# Patient Record
Sex: Female | Born: 1945
Health system: Southern US, Community
[De-identification: ages and names within clinical notes are randomized; demographics above are authoritative.]

## PROBLEM LIST (undated history)

## (undated) DIAGNOSIS — F419 Anxiety disorder, unspecified: Secondary | ICD-10-CM

## (undated) DIAGNOSIS — I10 Essential (primary) hypertension: Secondary | ICD-10-CM

## (undated) DIAGNOSIS — R011 Cardiac murmur, unspecified: Secondary | ICD-10-CM

## (undated) DIAGNOSIS — K219 Gastro-esophageal reflux disease without esophagitis: Secondary | ICD-10-CM

## (undated) DIAGNOSIS — R399 Unspecified symptoms and signs involving the genitourinary system: Secondary | ICD-10-CM

## (undated) DIAGNOSIS — K649 Unspecified hemorrhoids: Secondary | ICD-10-CM

## (undated) DIAGNOSIS — K589 Irritable bowel syndrome without diarrhea: Secondary | ICD-10-CM

## (undated) DIAGNOSIS — E7800 Pure hypercholesterolemia, unspecified: Secondary | ICD-10-CM

## (undated) DIAGNOSIS — Z8719 Personal history of other diseases of the digestive system: Secondary | ICD-10-CM

## (undated) DIAGNOSIS — K5790 Diverticulosis of intestine, part unspecified, without perforation or abscess without bleeding: Secondary | ICD-10-CM

## (undated) DIAGNOSIS — M1990 Unspecified osteoarthritis, unspecified site: Secondary | ICD-10-CM

## (undated) HISTORY — PX: BREAST LUMPECTOMY: SHX2

## (undated) HISTORY — DX: Irritable bowel syndrome, unspecified: K58.9

## (undated) HISTORY — PX: ABDOMINAL HYSTERECTOMY: SHX81

## (undated) HISTORY — DX: Gastro-esophageal reflux disease without esophagitis: K21.9

## (undated) HISTORY — PX: CHOLECYSTECTOMY: SHX55

---

## 2000-06-27 ENCOUNTER — Encounter: Payer: Self-pay | Admitting: Specialist

## 2000-07-01 ENCOUNTER — Ambulatory Visit (HOSPITAL_COMMUNITY): Admission: RE | Admit: 2000-07-01 | Discharge: 2000-07-01 | Payer: Self-pay | Admitting: Specialist

## 2000-08-16 ENCOUNTER — Encounter (HOSPITAL_COMMUNITY): Admission: RE | Admit: 2000-08-16 | Discharge: 2000-09-15 | Payer: Self-pay | Admitting: Specialist

## 2000-11-15 ENCOUNTER — Encounter: Payer: Self-pay | Admitting: Obstetrics and Gynecology

## 2000-11-15 ENCOUNTER — Ambulatory Visit (HOSPITAL_COMMUNITY): Admission: RE | Admit: 2000-11-15 | Discharge: 2000-11-15 | Payer: Self-pay | Admitting: Obstetrics and Gynecology

## 2001-08-11 ENCOUNTER — Ambulatory Visit (HOSPITAL_COMMUNITY): Admission: RE | Admit: 2001-08-11 | Discharge: 2001-08-11 | Payer: Self-pay | Admitting: Internal Medicine

## 2001-08-11 HISTORY — PX: COLONOSCOPY: SHX174

## 2001-12-07 ENCOUNTER — Ambulatory Visit (HOSPITAL_COMMUNITY): Admission: RE | Admit: 2001-12-07 | Discharge: 2001-12-07 | Payer: Self-pay | Admitting: Obstetrics and Gynecology

## 2001-12-07 ENCOUNTER — Encounter: Payer: Self-pay | Admitting: Obstetrics and Gynecology

## 2001-12-13 ENCOUNTER — Ambulatory Visit (HOSPITAL_COMMUNITY): Admission: RE | Admit: 2001-12-13 | Discharge: 2001-12-13 | Payer: Self-pay | Admitting: Obstetrics and Gynecology

## 2001-12-13 ENCOUNTER — Encounter: Payer: Self-pay | Admitting: Obstetrics and Gynecology

## 2002-12-19 ENCOUNTER — Ambulatory Visit (HOSPITAL_COMMUNITY): Admission: RE | Admit: 2002-12-19 | Discharge: 2002-12-19 | Payer: Self-pay | Admitting: General Surgery

## 2002-12-19 ENCOUNTER — Encounter: Payer: Self-pay | Admitting: General Surgery

## 2003-12-20 ENCOUNTER — Ambulatory Visit (HOSPITAL_COMMUNITY): Admission: RE | Admit: 2003-12-20 | Discharge: 2003-12-20 | Payer: Self-pay | Admitting: Obstetrics and Gynecology

## 2004-08-05 ENCOUNTER — Ambulatory Visit (HOSPITAL_COMMUNITY): Admission: RE | Admit: 2004-08-05 | Discharge: 2004-08-05 | Payer: Self-pay | Admitting: Family Medicine

## 2004-08-07 ENCOUNTER — Ambulatory Visit (HOSPITAL_COMMUNITY): Admission: RE | Admit: 2004-08-07 | Discharge: 2004-08-07 | Payer: Self-pay | Admitting: Family Medicine

## 2004-09-28 ENCOUNTER — Inpatient Hospital Stay (HOSPITAL_COMMUNITY): Admission: RE | Admit: 2004-09-28 | Discharge: 2004-09-30 | Payer: Self-pay | Admitting: Obstetrics and Gynecology

## 2004-12-23 ENCOUNTER — Ambulatory Visit (HOSPITAL_COMMUNITY): Admission: RE | Admit: 2004-12-23 | Discharge: 2004-12-23 | Payer: Self-pay | Admitting: Obstetrics and Gynecology

## 2005-05-12 ENCOUNTER — Ambulatory Visit (HOSPITAL_COMMUNITY): Admission: RE | Admit: 2005-05-12 | Discharge: 2005-05-12 | Payer: Self-pay | Admitting: Family Medicine

## 2005-12-27 ENCOUNTER — Ambulatory Visit (HOSPITAL_COMMUNITY): Admission: RE | Admit: 2005-12-27 | Discharge: 2005-12-27 | Payer: Self-pay | Admitting: Obstetrics and Gynecology

## 2006-04-18 ENCOUNTER — Ambulatory Visit (HOSPITAL_COMMUNITY): Admission: RE | Admit: 2006-04-18 | Discharge: 2006-04-18 | Payer: Self-pay | Admitting: *Deleted

## 2007-02-09 ENCOUNTER — Ambulatory Visit (HOSPITAL_COMMUNITY): Admission: RE | Admit: 2007-02-09 | Discharge: 2007-02-09 | Payer: Self-pay | Admitting: Obstetrics and Gynecology

## 2007-06-08 ENCOUNTER — Ambulatory Visit (HOSPITAL_COMMUNITY): Admission: RE | Admit: 2007-06-08 | Discharge: 2007-06-08 | Payer: Self-pay | Admitting: Family Medicine

## 2007-09-09 ENCOUNTER — Emergency Department (HOSPITAL_COMMUNITY): Admission: EM | Admit: 2007-09-09 | Discharge: 2007-09-10 | Payer: Self-pay | Admitting: Emergency Medicine

## 2008-02-28 ENCOUNTER — Ambulatory Visit (HOSPITAL_COMMUNITY): Admission: RE | Admit: 2008-02-28 | Discharge: 2008-02-28 | Payer: Self-pay | Admitting: Obstetrics and Gynecology

## 2008-08-08 ENCOUNTER — Ambulatory Visit (HOSPITAL_COMMUNITY): Admission: RE | Admit: 2008-08-08 | Discharge: 2008-08-08 | Payer: Self-pay | Admitting: Family Medicine

## 2008-09-17 DIAGNOSIS — K644 Residual hemorrhoidal skin tags: Secondary | ICD-10-CM | POA: Insufficient documentation

## 2008-09-17 DIAGNOSIS — G43909 Migraine, unspecified, not intractable, without status migrainosus: Secondary | ICD-10-CM | POA: Insufficient documentation

## 2008-09-17 DIAGNOSIS — M129 Arthropathy, unspecified: Secondary | ICD-10-CM | POA: Insufficient documentation

## 2008-09-17 DIAGNOSIS — I10 Essential (primary) hypertension: Secondary | ICD-10-CM | POA: Insufficient documentation

## 2008-09-17 DIAGNOSIS — Z8719 Personal history of other diseases of the digestive system: Secondary | ICD-10-CM | POA: Insufficient documentation

## 2008-09-17 DIAGNOSIS — Z8659 Personal history of other mental and behavioral disorders: Secondary | ICD-10-CM

## 2008-09-17 DIAGNOSIS — K921 Melena: Secondary | ICD-10-CM | POA: Insufficient documentation

## 2008-09-17 DIAGNOSIS — E7800 Pure hypercholesterolemia, unspecified: Secondary | ICD-10-CM | POA: Insufficient documentation

## 2008-09-18 ENCOUNTER — Ambulatory Visit: Payer: Self-pay | Admitting: Internal Medicine

## 2008-09-18 DIAGNOSIS — R1319 Other dysphagia: Secondary | ICD-10-CM

## 2008-09-24 ENCOUNTER — Ambulatory Visit: Payer: Self-pay | Admitting: Internal Medicine

## 2008-09-24 ENCOUNTER — Ambulatory Visit (HOSPITAL_COMMUNITY): Admission: RE | Admit: 2008-09-24 | Discharge: 2008-09-24 | Payer: Self-pay | Admitting: Internal Medicine

## 2008-09-24 HISTORY — PX: ESOPHAGOGASTRODUODENOSCOPY: SHX1529

## 2009-03-03 ENCOUNTER — Ambulatory Visit (HOSPITAL_COMMUNITY): Admission: RE | Admit: 2009-03-03 | Discharge: 2009-03-03 | Payer: Self-pay | Admitting: Family Medicine

## 2010-03-05 ENCOUNTER — Ambulatory Visit (HOSPITAL_COMMUNITY): Admission: RE | Admit: 2010-03-05 | Discharge: 2010-03-05 | Payer: Self-pay | Admitting: Family Medicine

## 2010-04-18 ENCOUNTER — Emergency Department (HOSPITAL_COMMUNITY)
Admission: EM | Admit: 2010-04-18 | Discharge: 2010-04-18 | Payer: Self-pay | Source: Home / Self Care | Admitting: Emergency Medicine

## 2010-05-13 ENCOUNTER — Ambulatory Visit: Admit: 2010-05-13 | Payer: Self-pay | Admitting: Gastroenterology

## 2010-06-11 ENCOUNTER — Ambulatory Visit (INDEPENDENT_AMBULATORY_CARE_PROVIDER_SITE_OTHER): Payer: BC Managed Care – PPO | Admitting: Otolaryngology

## 2010-06-11 DIAGNOSIS — J343 Hypertrophy of nasal turbinates: Secondary | ICD-10-CM

## 2010-06-11 DIAGNOSIS — H6980 Other specified disorders of Eustachian tube, unspecified ear: Secondary | ICD-10-CM

## 2010-06-11 DIAGNOSIS — J310 Chronic rhinitis: Secondary | ICD-10-CM

## 2010-07-09 ENCOUNTER — Ambulatory Visit (INDEPENDENT_AMBULATORY_CARE_PROVIDER_SITE_OTHER): Payer: BC Managed Care – PPO | Admitting: Otolaryngology

## 2010-07-20 LAB — POCT CARDIAC MARKERS
Myoglobin, poc: 54.8 ng/mL (ref 12–200)
Troponin i, poc: <0.05 ng/mL (ref 0.00–0.09)

## 2010-07-20 LAB — BASIC METABOLIC PANEL
Chloride: 107 mEq/L (ref 96–112)
Creatinine, Ser: 0.69 mg/dL (ref 0.4–1.2)
GFR calc non Af Amer: >60 mL/min (ref >60)
Sodium: 140 mEq/L (ref 135–145)

## 2010-07-28 ENCOUNTER — Emergency Department (HOSPITAL_COMMUNITY): Payer: BC Managed Care – PPO

## 2010-07-28 ENCOUNTER — Encounter (HOSPITAL_COMMUNITY): Payer: Self-pay | Admitting: Radiology

## 2010-07-28 ENCOUNTER — Emergency Department (HOSPITAL_COMMUNITY)
Admission: EM | Admit: 2010-07-28 | Discharge: 2010-07-28 | Disposition: A | Discharge status: Home / Self Care | Payer: BC Managed Care – PPO | Attending: Emergency Medicine | Admitting: Emergency Medicine

## 2010-07-28 DIAGNOSIS — R110 Nausea: Secondary | ICD-10-CM | POA: Insufficient documentation

## 2010-07-28 DIAGNOSIS — M62838 Other muscle spasm: Secondary | ICD-10-CM | POA: Insufficient documentation

## 2010-07-28 DIAGNOSIS — K219 Gastro-esophageal reflux disease without esophagitis: Secondary | ICD-10-CM | POA: Insufficient documentation

## 2010-07-28 DIAGNOSIS — R071 Chest pain on breathing: Secondary | ICD-10-CM | POA: Insufficient documentation

## 2010-07-28 DIAGNOSIS — E785 Hyperlipidemia, unspecified: Secondary | ICD-10-CM | POA: Insufficient documentation

## 2010-07-28 DIAGNOSIS — Z79899 Other long term (current) drug therapy: Secondary | ICD-10-CM | POA: Insufficient documentation

## 2010-07-28 DIAGNOSIS — R002 Palpitations: Secondary | ICD-10-CM | POA: Insufficient documentation

## 2010-07-28 DIAGNOSIS — F411 Generalized anxiety disorder: Secondary | ICD-10-CM | POA: Insufficient documentation

## 2010-07-28 DIAGNOSIS — I10 Essential (primary) hypertension: Secondary | ICD-10-CM | POA: Insufficient documentation

## 2010-07-28 DIAGNOSIS — R1011 Right upper quadrant pain: Secondary | ICD-10-CM | POA: Insufficient documentation

## 2010-07-28 HISTORY — DX: Essential (primary) hypertension: I10

## 2010-07-28 LAB — T4: T4, Total: 8.1 ug/dL (ref 5.0–12.5)

## 2010-07-28 LAB — DIFFERENTIAL
Basophils Absolute: 0 10*3/uL (ref 0.0–0.1)
Lymphocytes Relative: 25 % (ref 12–46)
Neutrophils Relative %: 67 % (ref 43–77)

## 2010-07-28 LAB — COMPREHENSIVE METABOLIC PANEL
ALT: 25 U/L (ref 0–35)
AST: 20 U/L (ref 0–37)
Alkaline Phosphatase: 65 U/L (ref 39–117)
CO2: 26 mEq/L (ref 19–32)
Chloride: 104 mEq/L (ref 96–112)
GFR calc non Af Amer: >60 mL/min (ref >60)
Potassium: 3.7 mEq/L (ref 3.5–5.1)
Sodium: 139 mEq/L (ref 135–145)
Total Bilirubin: 0.7 mg/dL (ref 0.3–1.2)
Total Protein: 7.3 g/dL (ref 6.0–8.3)

## 2010-07-28 LAB — CBC
MCHC: 33 g/dL (ref 30.0–36.0)
Platelets: 247 10*3/uL (ref 150–400)
RDW: 12.9 % (ref 11.5–15.5)
WBC: 8.4 10*3/uL (ref 4.0–10.5)

## 2010-07-28 LAB — POCT CARDIAC MARKERS
CKMB, poc: <1 ng/mL — ABNORMAL LOW (ref 1.0–8.0)
Myoglobin, poc: 48.5 ng/mL (ref 12–200)
Troponin i, poc: <0.05 ng/mL (ref 0.00–0.09)

## 2010-07-28 LAB — TSH: TSH: 3.455 u[IU]/mL (ref 0.350–4.500)

## 2010-08-03 ENCOUNTER — Other Ambulatory Visit (INDEPENDENT_AMBULATORY_CARE_PROVIDER_SITE_OTHER): Payer: Self-pay | Admitting: Internal Medicine

## 2010-08-03 ENCOUNTER — Ambulatory Visit (INDEPENDENT_AMBULATORY_CARE_PROVIDER_SITE_OTHER): Payer: BC Managed Care – PPO | Admitting: Internal Medicine

## 2010-08-03 DIAGNOSIS — R634 Abnormal weight loss: Secondary | ICD-10-CM

## 2010-08-03 DIAGNOSIS — R1013 Epigastric pain: Secondary | ICD-10-CM

## 2010-08-03 DIAGNOSIS — R110 Nausea: Secondary | ICD-10-CM

## 2010-08-05 ENCOUNTER — Ambulatory Visit (HOSPITAL_COMMUNITY)
Admission: RE | Admit: 2010-08-05 | Discharge: 2010-08-05 | Disposition: A | Discharge status: Home / Self Care | Payer: BC Managed Care – PPO | Source: Ambulatory Visit | Attending: Internal Medicine | Admitting: Internal Medicine

## 2010-08-05 DIAGNOSIS — R110 Nausea: Secondary | ICD-10-CM

## 2010-08-05 DIAGNOSIS — R1013 Epigastric pain: Secondary | ICD-10-CM

## 2010-08-05 DIAGNOSIS — R634 Abnormal weight loss: Secondary | ICD-10-CM

## 2010-08-05 MED ORDER — IOHEXOL 300 MG/ML  SOLN
100.0000 mL | Freq: Once | INTRAMUSCULAR | Status: AC | PRN
  Administered 2010-08-05: 100 mL via INTRAVENOUS

## 2010-08-11 NOTE — Consult Note (Addendum)
Allison [name]              ACCOUNT NO.:  [id number]  MEDICAL RECORD NO.:  [id number]           PATIENT TYPE:  E  LOCATION:  [facility]                          FACILITY:  [facility]  PHYSICIAN:  Lionel December, M.D.    DATE OF BIRTH:  January 04, 1946  DATE OF CONSULTATION:  08/03/2010 DATE OF DISCHARGE:  07/28/2010                                CONSULTATION   PRESENTING COMPLAINT:  Nausea, anorexia, epigastric pain and weight loss.  HISTORY OF PRESENT ILLNESS:  Allison [name] is a 65 year old Caucasian female who is referred through courtesy of Dr. Karleen Hampshire for GI evaluation. She is well-known to me from previous evaluation in May 2004.  Since then she was seen by Dr. Jena Gauss for dysphagia and had her esophagus dilated in May 2010.  She now presents with multiple symptoms.  She states her present illness began in November 2011, when she was working in a yard and developed congestion.  She thought she might have an allergy to dust etc.  She also developed pain in her neck radiating to her years.  She was seen by Dr. Sherwood Gambler in December and her PPI was doubled.  Ever since she has been worried and nervous and over the last 1 week she has developed intractable nausea.  She has lost her appetite.  She states food does not taste the same.  She complains of butterflies in the stomach.  She also complains of intermittent sharp pain in her epigastrium radiating to the right side.  She states she was on chlordiazepoxide/amitriptyline for couple of years but she stopped the last year.  With onset of these symptoms she was put back on the same medicine and she took it for about 2 weeks but she just did not feel right and therefore stopped the medicine.  She states her nausea is worse in the morning and as the day progresses, she seemed to get better and by evening she is free of nausea.  At times, she does feel depressed.  She states she has been busy with her family and 5 beautifully grandchildren.  She  states she lost her husband 11 years ago and she believes she has been coping this loss quite well.  She tells me that she feels that there is something bad going on with her which she is not sure what.  She denies heartburn, dysphagia, fever or chills.  She states in the last 2 years she has lost 30 pounds but she believes most of it is a voluntary weight loss.  Six days ago she developed chest pain and she was seen in emergency room at [facility] and she had CBC, Chem-20, cardiac enzymes, EKG and a chest x- ray and all of these studies were normal.  She was given dose of Zofran and she believes it made her constipated, but usually her bowels have been regular.  She states with constipation she strains and she notes scant amount of blood with her stools.  She denies melena or frank rectal bleeding.  She also denies dysuria or hematuria.  She states few weeks ago when we had snow she opened the trunk  of her car and while she was looking inside the front gate hit her back and she does not believe that she had any injury because of it.  CURRENT MEDICATIONS: 1. Simvastatin 20 mg daily. 2. Ibuprofen 800 mg daily p.r.n. which is no more than two to three     times a month. 3. Fish oil 1.2 grams once or twice daily. 4. Celebrex 100 mg b.i.d. just begun and she was seen in the emergency     room. 5. ASA 81 mg daily. 6. Zofran 8 mg q. 6 p.r.n.  PAST MEDICAL HISTORY:  She has been hypertensive for about 20 years. She has been on therapy for hyperlipidemia for about 5 years.  PAST SURGICAL HISTORY:  She had C-section in 1973 and again 1976.  She had left knee arthroscopy in 2002.  She had a hysterectomy in 1991 and in 2007 she had bilateral oophorectomy and cholecystectomy at the same time.  She had a benign lesion removed from her left breast in 1994. She had a screening colonoscopy by me in April 2003 and she had EGD with ED by Dr. Jena Gauss in May 2010.  She had Schatzki ring and a small  sliding hiatal hernia.  History of stress disorder.  ALLERGIES:  CODEINE and SULFA causing hives and nausea respectively.  FAMILY HISTORY:  Mother has COPD and died at 34.  Father is 27 years old and doing fair.  She has a sister age 54 with asthma and she has two brothers, one is diabetic, other one has GERD and asthma and she lost one brother age at 37.  He committed suicide.  SOCIAL HISTORY:  She is widowed.  She has two sons in good health.  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  She has never smoked cigarettes or drank alcohol.  OBJECTIVE:  VITAL SIGNS:  Weight 162 pounds, she is 64 inches tall, pulse 72 per minute, blood pressure 110/70, temperature is 97.7. HEENT:  Conjunctivae pink.  Sclerae nonicteric.  Oropharyngeal mucosa is normal. NECK:  No neck masses or thyromegaly noted. CARDIAC:  Regular rhythm.  Normal S1-S2.  No murmur or gallop noted. LUNGS:  Clear to auscultation. ABDOMEN:  Symmetrical.  Bowel sounds are normal.  On palpation, soft abdomen.  Aorta is easily palpable and pulsatile.  No definite aneurysm palpated. RECTAL:  Deferred. EXTREMITIES:  No peripheral edema or clubbing noted.  LABORATORY DATA:  From recent ER visit reviewed.  CBC, comprehensive and chemistry panel normal.  ASSESSMENT:  Nausea appears to be a manifestation of stress or anxiety. She has a prescription for lorazepam which was given to her by Dr. Regino Schultze and Dr. Sherwood Gambler which she has not taken it.  I have asked her to take this medicine.  Since she is having some epigastric pain and has some weight loss and she has easily palpable aorta, need to further evaluate her abdomen and pelvis to make sure she does not have significant abnormality.  RECOMMENDATIONS:  We will schedule for abdominopelvic CT with contrast. The patient advised to take lorazepam 0.25 mg in the morning and at lunch time and 0.5 mg at bedtime.  If  abdominopelvic CT is normal, may consider EGD.  We appreciate the opportunity to participate in care of this nice lady.          ______________________________ Lionel December, M.D.     NR/MEDQ  D:  08/04/2010  T:  08/04/2010  Job:  [id number]  cc:   Kirk Ruths, M.D. Fax: [phone]  Electronically Signed by Lionel December M.D. on 09/14/2010 11:55:58 AM

## 2010-08-18 ENCOUNTER — Encounter (INDEPENDENT_AMBULATORY_CARE_PROVIDER_SITE_OTHER): Payer: BC Managed Care – PPO | Admitting: Internal Medicine

## 2010-08-19 ENCOUNTER — Ambulatory Visit (HOSPITAL_COMMUNITY)
Admission: RE | Admit: 2010-08-19 | Discharge: 2010-08-19 | Disposition: A | Discharge status: Home / Self Care | Payer: BC Managed Care – PPO | Source: Ambulatory Visit | Attending: Internal Medicine | Admitting: Internal Medicine

## 2010-08-19 ENCOUNTER — Encounter (HOSPITAL_BASED_OUTPATIENT_CLINIC_OR_DEPARTMENT_OTHER): Payer: BC Managed Care – PPO | Admitting: Internal Medicine

## 2010-08-19 DIAGNOSIS — R1013 Epigastric pain: Secondary | ICD-10-CM

## 2010-08-19 DIAGNOSIS — K2970 Gastritis, unspecified, without bleeding: Secondary | ICD-10-CM | POA: Insufficient documentation

## 2010-08-19 DIAGNOSIS — R110 Nausea: Secondary | ICD-10-CM | POA: Insufficient documentation

## 2010-08-19 DIAGNOSIS — K2990 Gastroduodenitis, unspecified, without bleeding: Secondary | ICD-10-CM

## 2010-08-19 DIAGNOSIS — R634 Abnormal weight loss: Secondary | ICD-10-CM | POA: Insufficient documentation

## 2010-08-19 DIAGNOSIS — D131 Benign neoplasm of stomach: Secondary | ICD-10-CM | POA: Insufficient documentation

## 2010-08-19 DIAGNOSIS — K449 Diaphragmatic hernia without obstruction or gangrene: Secondary | ICD-10-CM | POA: Insufficient documentation

## 2010-08-19 HISTORY — PX: ESOPHAGOGASTRODUODENOSCOPY: SHX1529

## 2010-08-20 LAB — H. PYLORI ANTIBODY, IGG: H Pylori IgG: 0.5 {ISR}

## 2010-09-04 LAB — BASIC METABOLIC PANEL: Sodium: 139 mmol/L (ref 137–147)

## 2010-09-05 NOTE — Op Note (Signed)
  [name], [name]              ACCOUNT NO.:  [id number]  MEDICAL RECORD NO.:  [id number]           PATIENT TYPE:  O  LOCATION:  [facility]                          FACILITY:  [facility]  PHYSICIAN:  Lionel December, M.D.    DATE OF BIRTH:  07/08/1945  DATE OF PROCEDURE: DATE OF DISCHARGE:                              OPERATIVE REPORT   PROCEDURE:  Esophagogastroduodenoscopy.  INDICATION:  Allison [name] is 65 year old Caucasian female who has been experiencing nausea, epigastric pain, and she also has lost 30 pounds over a period of several months.  She has been also under lot of stress. She had normal routine lab studies.  Following her visit to our office, I did abdominopelvic CT on August 05, 2010, and no abnormality was noted to account for her symptoms.  It did show moderate facet arthropathy at L4-5 and L5-S1.  She has chronic GERD and she says her heartburn is well controlled with PPI.  She is undergoing diagnostic EGD.  Procedures were reviewed with the patient.  Informed consent was obtained.  MEDS FOR CONSCIOUS SEDATION:  Cetacaine spray for oropharyngeal topical anesthesia, Demerol 50 mg IV, Versed 6 mg IV.  FINDINGS:  Procedure performed in endoscopy suite.  The patient's vital signs and O2 sat were monitored during the procedure and remained stable.  The patient was placed in left lateral recumbent position and Pentax videoscope was passed through oropharynx without any difficulty into esophagus.  Esophagus.  Mucosa of the esophagus was normal.  GE junction was located at 36 cm from the incisors with noncritical Schatzki's ring.  Hiatus was at 38 cm from the incisors.  Stomach.  It was empty and distended very well by insufflation.  There were four small polyps at gastric body 2-5 mm with typical appearance of hyperplastic polyps and these are left alone.  There was a patchy erythema and granularity of the mucosa at antrum, but no erosions or ulcers were noted.  Pyloric channel was  patent.  Angularis, fundus and cardia were examined by retroflexion of scope and were normal.  Duodenum.  Bulbar mucosa was normal.  Scope was passed to second part of duodenal mucosa and folds were normal.  Endoscope was withdrawn.  The patient tolerated the procedure well.  FINAL DIAGNOSES: 1. Small sliding hiatal hernia with noncritical Schatzki's ring. 2. Four small hyperplastic-appearing polyps gastric body which were     left alone. 3. Nonerosive antral gastritis. 4. These findings would not explain patient's symptom complex.  RECOMMENDATIONS:  She will continue antireflux medicine Prilosec and lorazepam as before.  Unless symptoms regress, she will return for OV in 8 weeks.     Lionel December, M.D.     NR/MEDQ  D:  08/19/2010  T:  08/20/2010  Job:  [id number]  cc:   Kirk Ruths, M.D. Fax: [phone]  Electronically Signed by Lionel December M.D. on 09/05/2010 12:33:40 PM

## 2010-09-14 LAB — HEPATIC FUNCTION PANEL
ALT: 25 U/L (ref 7–35)
AST: 18 U/L (ref 13–35)
Bilirubin, Total: 0.4 mg/dL

## 2010-09-21 ENCOUNTER — Ambulatory Visit (INDEPENDENT_AMBULATORY_CARE_PROVIDER_SITE_OTHER): Payer: BC Managed Care – PPO | Admitting: Internal Medicine

## 2010-09-21 DIAGNOSIS — R634 Abnormal weight loss: Secondary | ICD-10-CM

## 2010-09-21 DIAGNOSIS — F430 Acute stress reaction: Secondary | ICD-10-CM

## 2010-09-22 NOTE — Op Note (Signed)
NAMEELIZBETH, Allison [name]              ACCOUNT NO.:  [id number]   MEDICAL RECORD NO.:  [id number]          PATIENT TYPE:  AMB   LOCATION:  DAY                           FACILITY:  [facility]   PHYSICIAN:  R. Roetta Sessions, M.D. DATE OF BIRTH:  1945-09-29   DATE OF PROCEDURE:  09/24/2008  DATE OF DISCHARGE:                               OPERATIVE REPORT   PROCEDURE:  EGD with Elease Hashimoto dilation, biopsy disruption of Schatzki's  ring.   INDICATIONS FOR PROCEDURE:  A 65 year old lady with a longstanding  several year history of intermittent esophageal dysphagia to solids.  She has occasional reflux symptoms for which she takes Prilosec 20 mg  orally daily.  She has had some atypical chest pain.  Recent cardiac  workup was negative.  An EGD is now being done.  Potential for  esophageal dilation reviewed.  The risks, benefits, alternatives and  limitations have been discussed.  Please see the documentation in  medical record.   PROCEDURE NOTE:  The O2 saturation, blood pressure, pulse and  respirations were monitored throughout the entire procedure.   CONSCIOUS SEDATION:  1. Versed 6 mg IV.  2. Demerol 75 mg IV in divided doses.   INSTRUMENT:  Pentax video chip system.   FINDINGS:  Examination of the tubular esophagus revealed a prominent  Schatzki's ring, otherwise esophageal mucosa appeared entirely normal.  EG junction was easily traversed.  The remaining stomach, colon and  gastric cavity was emptied and insufflated well with air.  A thorough  examination of the gastric mucosa, including retroflexion of the  proximal stomach, esophagogastric junction demonstrated a small hiatal  hernia and some bile-stained gastric mucus.  Pylorus was patent and  easily traversed.  Examination of the bulb and second portion revealed  no abnormalities.   THERAPEUTIC/DIAGNOSTIC MANEUVERS PERFORMED:  The scope was withdrawn.  A  54-French Maloney dilator was passed under full insertion with ease.  A  look  back revealed the ring remained intact.  I withdrew the scope and  subsequently passed a 56-French Maloney dilator, this was passed under  full insertion with slight resistance.  It was removed.  A look back  revealed the ring remained intact even after passage of the 56-French  [facility] dilator.  Subsequently, I obtained the gentle biopsy forceps and  took three strategic bites of the ring to disrupt it.  This was done  without difficulty or apparent complication, there was minimal bleeding  and appeared to be good disruption of the ring with this maneuver.  The  patient tolerated the procedure well, was reactive to endoscopy.   IMPRESSION:  1. Schatzki's ring, otherwise normal esophagus status post dilation      and disruption described above.  2. Hiatal hernia, bile-stained gastric mucus, otherwise gastric mucosa      was patent, pylorus normal D-1, D-2.   RECOMMENDATIONS:  1. Hiatal hernia and reflux literature provided to [name].  2. She should take Prilosec 20 mg every day before breakfast.  3. She is urged to let me know should she have any recurrent      difficulties with  dysphagia in the future.      Jonathon Bellows, M.D.  Electronically Signed     RMR/MEDQ  D:  09/24/2008  T:  09/24/2008  Job:  [id number]   cc:   Kirk Ruths, M.D.  Fax: [phone]

## 2010-09-25 NOTE — Discharge Summary (Signed)
NAMESHEREEN, MARTON              ACCOUNT NO.:  [id number]   MEDICAL RECORD NO.:  [id number]          PATIENT TYPE:  INP   LOCATION:  A428                          FACILITY:  [facility]   PHYSICIAN:  Tilda Burrow, M.D. DATE OF BIRTH:  30-May-1945   DATE OF ADMISSION:  09/28/2004  DATE OF DISCHARGE:  05/24/2006LH                                 DISCHARGE SUMMARY   ADMITTING DIAGNOSIS:  1.  Left ovarian cyst with normal CA 125.  2.  Cholelithiasis.  3.  Cholecystitis.   DISCHARGE DIAGNOSES:  1.  Cholecystitis.  2.  Cholelithiasis.  3.  Left ovarian benign serous cyst adenoma.   PROCEDURE:  Laparoscopic cholecystectomy by Franky Macho.  Laparotomy with  bilateral salpingo-oophorectomy by Jannifer Franklin.   HOSPITAL SUMMARY:  This 65 year old postmenopausal female was admitted for  combined surgery cholecystectomy plus laparotomy.  Cholecystectomy was  performed initially by Dr. Lovell Sheehan followed by turning the case over to me.  The patient had extensive pelvic adhesions and was found to have a cystic  structure in the left adnexa which was excised and it had completely benign  characteristics.  Final pathology report showed serous cyst  adenoma.  The patient had a straightforward postoperative course with postop  hemoglobin 11.3, hematocrit 33.1 compared to a hemoglobin 13 and 37.5 preop.  Patient did not require transfusion.  She was stable for discharge on postop  day #2 for followup 1 week later my office for staple removal.      JVF/MEDQ  D:  10/08/2004  T:  10/08/2004  Job:  [id number]   cc:   Dalia Heading, M.D.  [address] [place]  [place] [address]  Fax: [phone]   [facility]

## 2010-09-25 NOTE — Op Note (Signed)
NAMEEMERIE, VANDERKOLK              ACCOUNT NO.:  [id number]   MEDICAL RECORD NO.:  [id number]          PATIENT TYPE:  AMB   LOCATION:  DAY                           FACILITY:  [facility]   PHYSICIAN:  Dalia Heading, M.D.  DATE OF BIRTH:  Aug 20, 1945   DATE OF PROCEDURE:  09/28/2004  DATE OF DISCHARGE:                                 OPERATIVE REPORT   PREOPERATIVE DIAGNOSIS:  Cholecystitis, cholelithiasis.   POSTOPERATIVE DIAGNOSIS:  Cholecystitis, cholelithiasis.   PROCEDURE:  Laparoscopic cholecystectomy.   SURGEON:  Dalia Heading, M.D.   ANESTHESIA:  General endotracheal.   INDICATIONS:  The patient is a 65 year old white female who presents with  cholecystitis secondary to cholelithiasis.  The risks and benefits of the procedure including bleeding, infection,  hepatobiliary injury, and the possibility an open procedure, were fully  explained to the patient, gave informed consent.   PROCEDURE NOTE:  The patient was placed in supine position.  After induction  of general endotracheal anesthesia, the abdomen was prepped and draped using  the usual sterile technique with Betadine.  Surgical site confirmation was  performed.   A supraumbilical incision was made down to the fascia.  A Veress needle was  introduced into the abdominal cavity and confirmation of placement was done  using the saline drop test.  The abdomen was then insufflated to 16 mmHg  pressure.  An 11 mm trocar was introduced into the abdominal cavity under  direct visualization without difficulty.  The patient was placed in reverse  Trendelenburg position and an additional 1-mm trocars placed epigastric  region and 5-mm trocars placed the right upper quadrant and right flank  regions.  The liver was inspected and noted to within normal limits.  The  gallbladder was retracted superiorly and laterally.  The dissection was  begun around the infundibulum of the gallbladder.  The cystic duct was first  identified.   Its juncture to the infundibulum was fully identified.  Endoclips were placed proximally and distally on the cystic duct and the  cystic duct was divided.  This was likewise done to the cystic artery.  The  gallbladder was then freed away from the gallbladder fossa using Bovie  electrocautery.  The gallbladder was delivered through the epigastric trocar  site using EndoCatch bag.  The gallbladder fossa was inspected and no  abnormal bleeding or bile leakage was noted.  Surgicel was placed in the  gallbladder fossa.  All fluid and air were then evacuated from the abdominal  cavity prior to removal of the trocars.   All wounds were irrigated with normal saline.  All wounds were injected with  0.5% Sensorcaine.  The supraumbilical fascia was reapproximated using an 0  Vicryl interrupted suture.  All skin incisions were closed using staples.  Betadine ointment and dry sterile dressings were applied.   All tape and needle counts were correct at the end of the procedure.  The  the patient was about to undergo a left oophorectomy by Dr. Emelda Fear.  He  will dictate his portion of the procedure.   COMPLICATIONS:  None.   SPECIMEN:  Gallbladder with stones.   BLOOD LOSS:  Minimal.      MAJ/MEDQ  D:  09/28/2004  T:  09/28/2004  Job:  [id number]   cc:   Kirk Ruths, M.D.  [address]  [place]  [place] [address]  Fax: [phone]   Tilda Burrow, M.D.  Fax: [phone]

## 2010-09-25 NOTE — H&P (Signed)
Allison [name], Allison [name]              ACCOUNT NO.:  [id number]   MEDICAL RECORD NO.:  [id number]          PATIENT TYPE:  AMB   LOCATION:                                FACILITY:  [facility]   PHYSICIAN:  Dalia Heading, M.D.  DATE OF BIRTH:  January 29, 1946   DATE OF ADMISSION:  DATE OF DISCHARGE:  LH                                HISTORY & PHYSICAL   CHIEF COMPLAINT:  Cholecystitis, cholelithiasis.   HISTORY OF PRESENT ILLNESS:  The patient is a 65 year old white female who  is referred for evaluation and treatment of biliary colic secondary to  cholelithiasis.  She has been having right upper quadrant abdominal pain  with radiation to the right flank, nausea, bloating for several weeks.  She  does have fatty food intolerance.  No fevers, chills, jaundice have been  noted.  She has also been noted to have a large left ovarian cyst.   PAST MEDICAL HISTORY:  Includes hypertension, depression.   PAST SURGICAL HISTORY:  Multiple Cesarean section's.  Hysterectomy.  Lumpectomy under her left arm.  Knee surgery.   CURRENT MEDICATIONS:  Premarin, Ziac, Lipitor, amitriptyline.   ALLERGIES:  SULFA.   REVIEW OF SYMPTOMS:  Noncontributory.   PHYSICAL EXAMINATION:  GENERAL APPEARANCE:  The patient is a well-developed,  well-nourished white female in no acute distress.  VITAL SIGNS:  She is afebrile and vital signs are stable.  HEENT:  Examination reveals no scleral icterus.  LUNGS:  Clear to auscultation with equal breath sounds bilaterally.  CARDIAC:  Heart examination reveals regular rate and rhythm without S3 or S4  or murmurs.  ABDOMEN:  The abdomen is soft and nondistended.  She is tender in the right  upper quadrant to palpation.  No hepatosplenomegaly, masses or hernias are  identified.   CLINICAL DATA:  Ultrasound of the gallbladder revealed cholelithiasis with  normal common bile duct.   IMPRESSION:  Cholelithiasis, biliary colic and large left ovarian cyst.   PLAN:  The patient is  scheduled to undergo laparoscopic cholecystectomy on  Sep 28, 2004.  At the same time, she will undergo a pelvic surgery by Dr.  Emelda Fear for the large left ovarian cyst.  The risks and benefits of the  procedure including bleeding, infection, hepatobiliary injury, the  possibility of an open procedure were fully explained to the patient, who  gave informed consent.      MAJ/MEDQ  D:  09/17/2004  T:  09/17/2004  Job:  [id number]   cc:   Kirk Ruths, M.D.  [address]  [place]  [place] [address]  Fax: [phone]

## 2010-09-25 NOTE — Op Note (Signed)
Allison [name], Allison [name]              ACCOUNT NO.:  [id number]   MEDICAL RECORD NO.:  [id number]          PATIENT TYPE:  AMB   LOCATION:  DAY                           FACILITY:  [facility]   PHYSICIAN:  Tilda Burrow, M.D. DATE OF BIRTH:  01/17/1946   DATE OF PROCEDURE:  09/28/2004  DATE OF DISCHARGE:                                 OPERATIVE REPORT   PREOPERATIVE DIAGNOSIS:  Cystic left ovarian mass.   POSTOPERATIVE DIAGNOSIS:  Left hydrosalpinx.   PROCEDURE:  Bilateral salpingo-oophorectomy, extensive lysis of pelvic  adhesions.   SURGEON:  Tilda Burrow, M.D.   ASSISTANTAmie Critchley, C.S.T.   ANESTHESIA:  General.   COMPLICATIONS:  None.   FINDINGS:  Large cystic structure near the left adnexa representing perhaps  ovarian cyst but also suspected hydrosalpinx.  Right tube and ovary densely  adherent to pelvic sidewall.  Thick omentum and thick peri-bowel (epiploic)  fat.   DETAILS OF PROCEDURE:  The patient was taken to the operating room, prepped  and draped already for the laparoscopic cholecystectomy.  The prior midline  vertical incision was repeated from mons pubis approximately halfway to the  umbilicus with Bovie cautery dissection down to just above the fascia, which  was then opened with a knife.  The preperitoneal fat was opened by elevating  it with hemostats and sharply incising into the peritoneum with no evidence  of trauma.  The omentum was very thick and just beneath the opening.  The  omentum was stuck to the upper aspect of the previous midline vertical  incision site and required transection.  This was performed and Bovie  cautery used on the raw edges as necessary to achieve hemostasis.   Then we started working to visualize the pelvis.  Everything was stuck  together.  The sigmoid could be identified, and it was attached to the top  of the vaginal cuff and bladder.  We were able to delineate the margins of  the cyst and to use a combination of blunt  and sharp dissection to peel the  cystic structure out of the left side of the pelvic gutter.  This was  rotated up in a way that we could identify the infundibulopelvic ligament,  crossclamp it and excise the specimen.  The remainder of the adhesions along  to the pelvic sidewall could be sharply dissected.  The ureter and blood  vessels in the retroperitoneum were at no time considered to be a danger at  risk.   We then proceeded with inspection of the bowel and did some loosening up of  the sigmoid colon over the bladder flap, but the central portion of the  adhesions were left intact.  Attention was then directed to the right side,  which showed filmier adhesions but they could more easily be broken up.  We  were able to identify the tube and ovary lying on the sidewall and densely  adherent there.  The retroperitoneum was entered on the right, the IP  ligament identified separate from the ureter, the IP ligament isolated,  clamped, cut, and suture ligated.  We then  peeled the tube and ovary off of  the sidewall using a combination of sharp dissection and hemostats with the  tube and ovary peeled ultimately off the sidewall and from the attachments  to the remnants of the round ligament with a Kelly clamp and transection,  followed by a ligature on the remnant of the round ligament.   Irrigation followed and inspection showed adequate hemostasis.  There was  some oozing from the bladder flap itself.  This required point cautery while  remaining very superficial.  We then inspected for adequate hemostasis and  deemed that it was appropriate to close.  The anterior peritoneum was closed  with 2-0 chromic, the fascia with continuous 0 Monocryl, and the  subcutaneous tissues approximated with 2-0 plain.  Staple closure of the  skin with the small staples completed the procedure.  The patient went to  the recovery room in good condition and sponge and needle counts correct.  Estimated  blood loss for my portion of the procedure:  100 mL.      JVF/MEDQ  D:  09/28/2004  T:  09/28/2004  Job:  [id number]

## 2010-09-25 NOTE — H&P (Signed)
NAMEHALEN, MOSSBARGER              ACCOUNT NO.:  [id number]   MEDICAL RECORD NO.:  [id number]          PATIENT TYPE:  AMB   LOCATION:  DAY                           FACILITY:  [facility]   PHYSICIAN:  Tilda Burrow, M.D. DATE OF BIRTH:  12/18/1945   DATE OF ADMISSION:  09/28/2004  DATE OF DISCHARGE:  LH                                HISTORY & PHYSICAL   ADMITTING DIAGNOSIS:  Left ovarian cyst with normal CA125.   HISTORY OF PRESENT ILLNESS:  This 65 year old postmenopausal female, status  post hysterectomy, is admitted at this time for laparoscopic cholecystectomy  by Dr. Lovell Sheehan, to be combined with exploratory laparotomy and removal of  left ovary.  Tonna has been seen in our office in referral and consultation  by Dr. Lovell Sheehan, who has evaluated her for recent abdominal pain, which  included identified cholelithiasis.  She had a pelvic ultrasound which  showed a complex cystic mass in the left pelvis, 8.6 x 6.9 x 6.7 cm, with CT  of the abdomen showing no mesenteric retroperitoneal nodes or adenopathy.  No ascites was noted.  CA125 was in the normal range.  Lipid panel was  notable for elevated cholesterol of 218, triglycerides elevated at 291, with  elevate VLDL, as well.  The patient was referred to out office, where she  has been counseled of the need for excision of the residual ovary.  She has  had a hysterectomy and removal of the right ovary in the past.  The normal  CA125 of 7.1 makes the likelihood of malignancy quite low.  I have  specifically emphasized to Ms. Mathies that no absolute reassurances of the  benign nature of the lesion can be given, that tissue tests on the ovarian  mass will be necessary.  The patient understands that in the event that  laparoscopic visualization suggests malignancy that a vertical incision will  be necessary, and omentectomy and other biopsies if felt clinically  indicated will be performed.   PAST MEDICAL HISTORY:  Hypertension.   SURGICAL HISTORY:  1.  Cesarean section x2 in 1973 and 1976.  2.  Hysterectomy in 1991 at [facility].  3.  Knee surgery in 2002.  4.  Axillary lymph node dissection in 1994.   ALLERGIES:  SULFA.   MEDICATIONS:  1.  Premarin.  2.  Ziac.  3.  Amitriptyline.  4.  Lipitor.   PHYSICAL EXAMINATION:  VITAL SIGNS:  Height 5 feet, 4 inches, weight 189,  blood pressure 128/78.  GENERAL:  A healthy-appearing, cheerful Caucasian female, alert and oriented  x3.  HEENT:  Pupils equal, round and reactive.  Extraocular movements intact.  NECK:  Supple.  Trachea midline.  ABDOMEN:  Nontender.  Well-healed lower abdominal scars.  Bimanual exam  shows slight fullness.  There is no suggestion of fluid wave per abdomen.   IMPRESSION:  An 8-9 cm cystic left ovarian mass, with CA125 suggesting  benign etiology.   PLAN:  Laparoscopic cholecystectomy followed by exploratory laparotomy on  Sep 28, 2004.      JVF/MEDQ  D:  09/27/2004  T:  09/27/2004  Job:  [id number]   cc:   Dalia Heading, M.D.  [address], [place]  [place]  [place] [address]  Fax: [phone]   [facility]   Kirk Ruths, M.D.  [address]  [place]  [place] [address]  Fax: [phone]

## 2010-11-10 ENCOUNTER — Ambulatory Visit (INDEPENDENT_AMBULATORY_CARE_PROVIDER_SITE_OTHER): Payer: BC Managed Care – PPO | Admitting: Internal Medicine

## 2010-11-10 DIAGNOSIS — K589 Irritable bowel syndrome without diarrhea: Secondary | ICD-10-CM

## 2010-11-10 DIAGNOSIS — F430 Acute stress reaction: Secondary | ICD-10-CM

## 2010-12-14 ENCOUNTER — Encounter (INDEPENDENT_AMBULATORY_CARE_PROVIDER_SITE_OTHER): Payer: Self-pay

## 2011-01-12 ENCOUNTER — Encounter (INDEPENDENT_AMBULATORY_CARE_PROVIDER_SITE_OTHER): Payer: Self-pay | Admitting: Internal Medicine

## 2011-01-12 ENCOUNTER — Ambulatory Visit (INDEPENDENT_AMBULATORY_CARE_PROVIDER_SITE_OTHER): Payer: BC Managed Care – PPO | Admitting: Internal Medicine

## 2011-01-12 VITALS — BP 110/70 | HR 74 | Temp 97.3°F | Ht 64.0 in | Wt 155.0 lb

## 2011-01-12 DIAGNOSIS — K589 Irritable bowel syndrome without diarrhea: Secondary | ICD-10-CM

## 2011-01-12 DIAGNOSIS — K219 Gastro-esophageal reflux disease without esophagitis: Secondary | ICD-10-CM

## 2011-01-12 DIAGNOSIS — R634 Abnormal weight loss: Secondary | ICD-10-CM

## 2011-01-12 MED ORDER — OMEPRAZOLE MAGNESIUM 20 MG PO TBEC: 20.0000 mg | DELAYED_RELEASE_TABLET | Freq: Every day | ORAL | Status: DC

## 2011-01-12 NOTE — Patient Instructions (Signed)
Take Ibuprofen 400 mg up to three times with food. Continue antireflux measures and Prilosec  Daily before breakfast.

## 2011-01-13 NOTE — Progress Notes (Signed)
Presenting complaint; follow-up for weight loss and diarrhea. Last visit 11/10/2010. Subjective; patient states she is feeling a lot better. She has gained 5 lbs and now worried about weight gain. She is having more days with formed stools, diarrhea on some days and at times is constipated. She is still having abdominal cramps but not as often. She denies heartburn, nausea or anorexia. She is having worsening right knee pain and takes ibuprofen daily. Trying to delay knee replacement as long as she could. Current medications; Current Outpatient Prescriptions on File Prior to Visit  Medication Sig Dispense Refill  . bisoprolol-hydrochlorothiazide (ZIAC) 10-6.25 MG per tablet Take 1 tablet by mouth daily.        . fish oil-omega-3 fatty acids 1000 MG capsule Take 2 g by mouth daily.        Marland Kitchen ibuprofen (ADVIL,MOTRIN) 800 MG tablet Take 800 mg by mouth as needed.       Marland Kitchen LORazepam (ATIVAN) 0.5 MG tablet Take 0.5 mg by mouth as needed.       Marland Kitchen omeprazole (PRILOSEC) 20 MG capsule Take 20 mg by mouth daily.        . simvastatin (ZOCOR) 20 MG tablet Take 20 mg by mouth at bedtime.        . dicyclomine (BENTYL) 10 MG capsule Take 10 mg by mouth 4 (four) times daily -  before meals and at bedtime.        Objective; BP 110/70  Pulse 74  Temp(Src) 97.3 F (36.3 C) (Oral)  Ht 5\' 4"  (1.626 m)  Wt 155 lb (70.308 kg)  BMI 26.61 kg/m2  Well-developed, well-nourished, pleasant and cooperative.      Sclera clear, no icterus.   Conjunctiva pink.   Oropharyngeal mucosa is normal.  Supple; no masses or thyromegaly.   Regular rate and rhythm; no murmurs, clicks, rubs,  or gallops.  Clear to auscultation. Abdomen is soft, symmetrical, nontender and nondistended. No masses, hepatosplenomegaly.  No  clubbing or LE edema noted. Assessment; 1. IBS improved with therapy. 2. Weight loss possibly secondary to stress disorder;      work-up has negative. Weight coming up. 3. GERD.; doing well with therapy. 4. Stress  Disorder controlled with therapy. Plan; Patient advised to take ibuprofen no more than 400 mg with each meal or less often. Script for Omeprazole renewed for one year. OV in 6 months.

## 2011-02-22 ENCOUNTER — Other Ambulatory Visit: Payer: Self-pay | Admitting: Obstetrics and Gynecology

## 2011-02-22 DIAGNOSIS — Z139 Encounter for screening, unspecified: Secondary | ICD-10-CM

## 2011-03-19 ENCOUNTER — Ambulatory Visit (HOSPITAL_COMMUNITY)
Admission: RE | Admit: 2011-03-19 | Discharge: 2011-03-19 | Disposition: A | Discharge status: Home / Self Care | Payer: BC Managed Care – PPO | Source: Ambulatory Visit | Attending: Obstetrics and Gynecology | Admitting: Obstetrics and Gynecology

## 2011-03-19 DIAGNOSIS — Z1231 Encounter for screening mammogram for malignant neoplasm of breast: Secondary | ICD-10-CM | POA: Insufficient documentation

## 2011-03-19 DIAGNOSIS — Z139 Encounter for screening, unspecified: Secondary | ICD-10-CM

## 2011-07-07 ENCOUNTER — Encounter (INDEPENDENT_AMBULATORY_CARE_PROVIDER_SITE_OTHER): Payer: Self-pay | Admitting: *Deleted

## 2011-08-03 ENCOUNTER — Ambulatory Visit (INDEPENDENT_AMBULATORY_CARE_PROVIDER_SITE_OTHER): Payer: BC Managed Care – PPO | Admitting: Internal Medicine

## 2011-08-24 ENCOUNTER — Encounter (INDEPENDENT_AMBULATORY_CARE_PROVIDER_SITE_OTHER): Payer: Self-pay | Admitting: Internal Medicine

## 2011-08-24 ENCOUNTER — Ambulatory Visit (INDEPENDENT_AMBULATORY_CARE_PROVIDER_SITE_OTHER): Payer: Medicare Other | Admitting: Internal Medicine

## 2011-08-24 VITALS — BP 110/70 | HR 74 | Temp 97.7°F | Resp 18 | Ht 64.0 in | Wt 165.5 lb

## 2011-08-24 DIAGNOSIS — K589 Irritable bowel syndrome without diarrhea: Secondary | ICD-10-CM

## 2011-08-24 DIAGNOSIS — K219 Gastro-esophageal reflux disease without esophagitis: Secondary | ICD-10-CM

## 2011-08-24 MED ORDER — OMEPRAZOLE MAGNESIUM 20 MG PO TBEC: 20.0000 mg | DELAYED_RELEASE_TABLET | Freq: Every day | ORAL | Status: DC

## 2011-08-24 NOTE — Patient Instructions (Signed)
Colonoscopy to be scheduled in July this year.

## 2011-08-24 NOTE — Progress Notes (Signed)
Presenting complaint;follow for GERD and IV  Follow for GERD and IBS.  Subjective:  Allison [name] is a 66 year old Caucasian female who is in for scheduled visit. She was last seen in September 2012. She continues to improve as far as her symptoms are concerned. She is over 90% better. She rarely has heartburn and regurgitation. On most days she has 1-2 formed stools. Occasionally she may have diarrhea and/or constipation. She denies melena or rectal bleeding. She is interested in having screening colonoscopy this summer since her last exam was in April 2003. She continues to be bothered by right knee pain secondary to osteoarthrosis. She is taking ibuprofen twice a day and experiencing no side effects. She has gained 10 pounds since her last visit.  Current Medications: Current Outpatient Prescriptions  Medication Sig Dispense Refill  . aspirin 81 MG tablet Take 81 mg by mouth daily.        . bisoprolol-hydrochlorothiazide (ZIAC) 10-6.25 MG per tablet Take 1 tablet by mouth daily.        . fish oil-omega-3 fatty acids 1000 MG capsule Take 2 g by mouth daily.        Marland Kitchen ibuprofen (ADVIL,MOTRIN) 800 MG tablet Take 400 mg by mouth as needed.       . Multiple Vitamins-Minerals (CENTRUM SILVER PO) Take by mouth daily.        Marland Kitchen omeprazole (PRILOSEC OTC) 20 MG tablet Take 1 tablet (20 mg total) by mouth daily.  90 tablet  3  . simvastatin (ZOCOR) 20 MG tablet Take 20 mg by mouth at bedtime.           Objective: Blood pressure 110/70, pulse 74, temperature 97.7 F (36.5 C), temperature source Oral, resp. rate 18, height 5\' 4"  (1.626 m), weight 165 lb 8 oz (75.07 kg). Patient appears to be in no distress. Conjunctiva is pink. Sclera is nonicteric Oropharyngeal mucosa is normal. No neck masses or thyromegaly noted. Cardiac exam with regular rhythm normal S1 and S2. No murmur or gallop noted. Lungs are clear to auscultation. Abdomen abdomen is symmetrical. Bowel sounds are normal. On palpation soft abdomen  without tenderness organomegaly or masses. No LE edema or clubbing noted.   Assessment:  Allison [name] is doing quite well. PPI is not only controlling her GERD symptoms but also reducing the likelihood of peptic ulcer disease secondary to NSAID therapy. She may want to use less ibuprofen if possible and may alternate with acetaminophen OTC   Plan:  Prescription for omeprazole refilled. SWchedule for screening colonoscopy in July 2013. Office visit in one year.

## 2011-11-17 ENCOUNTER — Encounter (INDEPENDENT_AMBULATORY_CARE_PROVIDER_SITE_OTHER): Payer: Self-pay | Admitting: *Deleted

## 2012-01-14 ENCOUNTER — Encounter (INDEPENDENT_AMBULATORY_CARE_PROVIDER_SITE_OTHER): Payer: Self-pay | Admitting: *Deleted

## 2012-02-08 ENCOUNTER — Telehealth (INDEPENDENT_AMBULATORY_CARE_PROVIDER_SITE_OTHER): Payer: Self-pay | Admitting: *Deleted

## 2012-02-08 ENCOUNTER — Other Ambulatory Visit (INDEPENDENT_AMBULATORY_CARE_PROVIDER_SITE_OTHER): Payer: Self-pay | Admitting: *Deleted

## 2012-02-08 DIAGNOSIS — Z1211 Encounter for screening for malignant neoplasm of colon: Secondary | ICD-10-CM

## 2012-02-08 NOTE — Telephone Encounter (Signed)
Patient needs movi prep 

## 2012-02-09 MED ORDER — PEG-KCL-NACL-NASULF-NA ASC-C 100 G PO SOLR: 1.0000 | Freq: Once | ORAL | Status: DC

## 2012-02-21 ENCOUNTER — Other Ambulatory Visit (HOSPITAL_COMMUNITY): Payer: Self-pay | Admitting: Family Medicine

## 2012-02-21 ENCOUNTER — Other Ambulatory Visit (HOSPITAL_COMMUNITY): Payer: Self-pay | Admitting: Obstetrics & Gynecology

## 2012-02-21 DIAGNOSIS — Z139 Encounter for screening, unspecified: Secondary | ICD-10-CM

## 2012-03-16 ENCOUNTER — Telehealth (INDEPENDENT_AMBULATORY_CARE_PROVIDER_SITE_OTHER): Payer: Self-pay | Admitting: *Deleted

## 2012-03-16 NOTE — Telephone Encounter (Signed)
  Procedure: tcs  Reason/Indication:  screening  Has patient had this procedure before?  yes  If so, when, by whom and where?  2003  Is there a family history of colon cancer?  no  Who?  What age when diagnosed?    Is patient diabetic?   no      Does patient have prosthetic heart valve?  no  Do you have a pacemaker?  no  Has patient had joint replacement within last 12 months?  no  Is patient on Coumadin, Plavix and/or Aspirin? yes  Medications: asa 81 mg daily, bisoprolol/hctz 10.6/25 mg daily, simvastatin 20 mg daily, Prilosec 20 mg daily, centrum silver daily, fish oil 1200 mg bid, glucosamine 500 mg bid  Allergies: see EPIC  Medication Adjustment: asa 2 days  Procedure date & time: 04/12/12 at 1030

## 2012-03-20 ENCOUNTER — Ambulatory Visit (HOSPITAL_COMMUNITY)
Admission: RE | Admit: 2012-03-20 | Discharge: 2012-03-20 | Disposition: A | Discharge status: Home / Self Care | Payer: Medicare Other | Source: Ambulatory Visit | Attending: Family Medicine | Admitting: Family Medicine

## 2012-03-20 DIAGNOSIS — Z1231 Encounter for screening mammogram for malignant neoplasm of breast: Secondary | ICD-10-CM | POA: Insufficient documentation

## 2012-03-20 DIAGNOSIS — Z139 Encounter for screening, unspecified: Secondary | ICD-10-CM

## 2012-03-20 NOTE — Telephone Encounter (Signed)
agree

## 2012-03-30 ENCOUNTER — Encounter (HOSPITAL_COMMUNITY): Payer: Self-pay | Admitting: Pharmacy Technician

## 2012-04-11 MED ORDER — SODIUM CHLORIDE 0.45 % IV SOLN
INTRAVENOUS | Status: DC
  Administered 2012-04-12: 10:00:00 via INTRAVENOUS

## 2012-04-12 ENCOUNTER — Ambulatory Visit (HOSPITAL_COMMUNITY)
Admission: RE | Admit: 2012-04-12 | Discharge: 2012-04-12 | Disposition: A | Discharge status: Home / Self Care | Payer: Medicare Other | Source: Ambulatory Visit | Attending: Internal Medicine | Admitting: Internal Medicine

## 2012-04-12 ENCOUNTER — Encounter (HOSPITAL_COMMUNITY): Payer: Self-pay | Admitting: *Deleted

## 2012-04-12 ENCOUNTER — Encounter (HOSPITAL_COMMUNITY)
Admission: RE | Disposition: A | Discharge status: Home / Self Care | Payer: Self-pay | Source: Ambulatory Visit | Attending: Internal Medicine

## 2012-04-12 DIAGNOSIS — K5730 Diverticulosis of large intestine without perforation or abscess without bleeding: Secondary | ICD-10-CM | POA: Insufficient documentation

## 2012-04-12 DIAGNOSIS — K644 Residual hemorrhoidal skin tags: Secondary | ICD-10-CM

## 2012-04-12 DIAGNOSIS — I10 Essential (primary) hypertension: Secondary | ICD-10-CM | POA: Insufficient documentation

## 2012-04-12 DIAGNOSIS — Z1211 Encounter for screening for malignant neoplasm of colon: Secondary | ICD-10-CM

## 2012-04-12 DIAGNOSIS — K6389 Other specified diseases of intestine: Secondary | ICD-10-CM

## 2012-04-12 HISTORY — DX: Anxiety disorder, unspecified: F41.9

## 2012-04-12 HISTORY — PX: COLONOSCOPY: SHX5424

## 2012-04-12 SURGERY — COLONOSCOPY
Anesthesia: Moderate Sedation

## 2012-04-12 MED ORDER — MEPERIDINE HCL 50 MG/ML IJ SOLN
INTRAMUSCULAR | Status: DC | PRN
  Administered 2012-04-12 (×2): 25 mg via INTRAVENOUS

## 2012-04-12 MED ORDER — MIDAZOLAM HCL 5 MG/5ML IJ SOLN
INTRAMUSCULAR | Status: AC
  Filled 2012-04-12: qty 10

## 2012-04-12 MED ORDER — STERILE WATER FOR IRRIGATION IR SOLN
Status: DC | PRN
  Administered 2012-04-12: 10:00:00

## 2012-04-12 MED ORDER — MIDAZOLAM HCL 5 MG/5ML IJ SOLN
INTRAMUSCULAR | Status: DC | PRN
  Administered 2012-04-12 (×2): 2 mg via INTRAVENOUS
  Administered 2012-04-12: 1 mg via INTRAVENOUS

## 2012-04-12 MED ORDER — MEPERIDINE HCL 50 MG/ML IJ SOLN
INTRAMUSCULAR | Status: AC
  Filled 2012-04-12: qty 1

## 2012-04-12 MED ORDER — ONDANSETRON HCL 4 MG/2ML IJ SOLN
INTRAMUSCULAR | Status: AC
  Filled 2012-04-12: qty 2

## 2012-04-12 NOTE — H&P (Signed)
Allison [name] is an 66 y.o. female.   Chief Complaint: Patient is here for colonoscopy. HPI: Patient is 66 year old Caucasian female who is here for average risk screening colonoscopy. She has intermittent diarrhea secondary to irritable bowel syndrome. She denies abdominal pain or rectal bleeding. Her last exam was in April 2003. Family history is negative for colorectal carcinoma.  Past Medical History  Diagnosis Date  . Hypertension   . Irritable bowel syndrome   . Gastroesophageal reflux   . Anxiety     Past Surgical History  Procedure Date  . Esophagogastroduodenoscopy 08/19/2010  . Esophagogastroduodenoscopy 09/24/2008  . Colonoscopy 08/11/2001    Family History  Problem Relation Age of Onset  . COPD Mother   . Irregular heart beat Father   . Healthy Sister   . Diverticulitis Brother   . Diabetes Brother   . Healthy Son   . Healthy Son    Social History:  reports that she has never smoked. She has never used smokeless tobacco. She reports that she does not drink alcohol or use illicit drugs.  Allergies:  Allergies  Allergen Reactions  . Codeine     REACTION: Unknown reaction  . Sulfonamide Derivatives     REACTION: Unknown reaction    Medications Prior to Admission  Medication Sig Dispense Refill  . acetaminophen (TYLENOL) 500 MG tablet Take 1,000 mg by mouth every 6 (six) hours as needed.      . bisoprolol-hydrochlorothiazide (ZIAC) 10-6.25 MG per tablet Take 1 tablet by mouth daily.        Marland Kitchen ibuprofen (ADVIL,MOTRIN) 800 MG tablet Take 400 mg by mouth every 8 (eight) hours as needed. pain      . Multiple Vitamins-Minerals (CENTRUM SILVER PO) Take by mouth daily.        . Omega-3 Fatty Acids (FISH OIL) 1200 MG CAPS Take 1,200 mg by mouth 2 (two) times daily.      Marland Kitchen omeprazole (PRILOSEC OTC) 20 MG tablet Take 1 tablet (20 mg total) by mouth daily.  90 tablet  3  . peg 3350 powder (MOVIPREP) 100 G SOLR Take 1 kit (100 g total) by mouth once.  1 kit  0  .  simvastatin (ZOCOR) 20 MG tablet Take 20 mg by mouth at bedtime.          No results found for this or any previous visit (from the past 48 hour(s)). No results found.  ROS  Blood pressure 134/68, pulse 64, temperature 98.4 F (36.9 C), temperature source Oral, resp. rate 22, height 5\' 4"  (1.626 m), weight 165 lb (74.844 kg), SpO2 99.00%. Physical Exam  Constitutional: She appears well-developed and well-nourished.  HENT:  Mouth/Throat: Oropharynx is clear and moist.  Eyes: Conjunctivae normal are normal. No scleral icterus.  Neck: No thyromegaly present.  Cardiovascular: Normal rate, regular rhythm and normal heart sounds.   No murmur heard. Respiratory: Effort normal and breath sounds normal.  GI: Soft. She exhibits no distension and no mass. There is no tenderness.  Musculoskeletal: She exhibits no edema.  Lymphadenopathy:    She has no cervical adenopathy.  Neurological: She is alert.  Skin: Skin is warm and dry.     Assessment/Plan Average risk screening colonoscopy.   REHMAN,NAJEEB U 04/12/2012, 10:09 AM

## 2012-04-12 NOTE — Op Note (Signed)
COLONOSCOPY PROCEDURE REPORT  PATIENT:  Allison [name]  MR#:  [id number] Birthdate:  1946/04/11, 66 y.o., female Endoscopist:  Dr. Malissa Hippo, MD Referred By:  Dr. Kirk Ruths, MD Procedure Date: 04/12/2012  Procedure:   Colonoscopy  Indications: Patient is 66 year old Caucasian female who is in for average risk screening colonoscopy. Patient's last exam was in April 2003.  Informed Consent:  The procedure and risks were reviewed with the patient and informed consent was obtained.  Medications:  Demerol 50 mg IV Versed 5 mg IV  Description of procedure:  After a digital rectal exam was performed, that colonoscope was advanced from the anus through the rectum and colon to the area of the cecum, ileocecal valve and appendiceal orifice. The cecum was deeply intubated. These structures were well-seen and photographed for the record. From the level of the cecum and ileocecal valve, the scope was slowly and cautiously withdrawn. The mucosal surfaces were carefully surveyed utilizing scope tip to flexion to facilitate fold flattening as needed. The scope was pulled down into the rectum where a thorough exam including retroflexion was performed.  Findings:   Prep excellent. Scattered diverticula at sigmoid and transverse colon. No evidence of colonic polyps or other mucosal abnormalities. Normal rectal mucosa. Hemorrhoids below the dentate line along with anal papillae.  Therapeutic/Diagnostic Maneuvers Performed:  None  Complications:  None  Cecal Withdrawal Time:  8 minutes  Impression:  Examination performed to cecum. No evidence of colonic polyps. Scattered diverticula at sigmoid colon along with few more at transverse colon. External hemorrhoids and anal papillae.  Recommendations:  Standard instructions given. Next screening exam in 10 years.  Claudy Abdallah U  04/12/2012 10:37 AM  CC: Dr. Kirk Ruths, MD & Dr. Bonnetta Barry ref. provider found

## 2012-04-17 ENCOUNTER — Encounter (HOSPITAL_COMMUNITY): Payer: Self-pay | Admitting: Internal Medicine

## 2012-05-20 IMAGING — CR DG CHEST 1V PORT
1 series · 1 of 1 positions shown · non-contrast
Comparison: Portable exam 3879 hours compared to 08/08/2008

CLINICAL DATA: Palpitations, spasms left chest, nausea, history
hypertension

PORTABLE CHEST - 1 VIEW

[view not recorded]
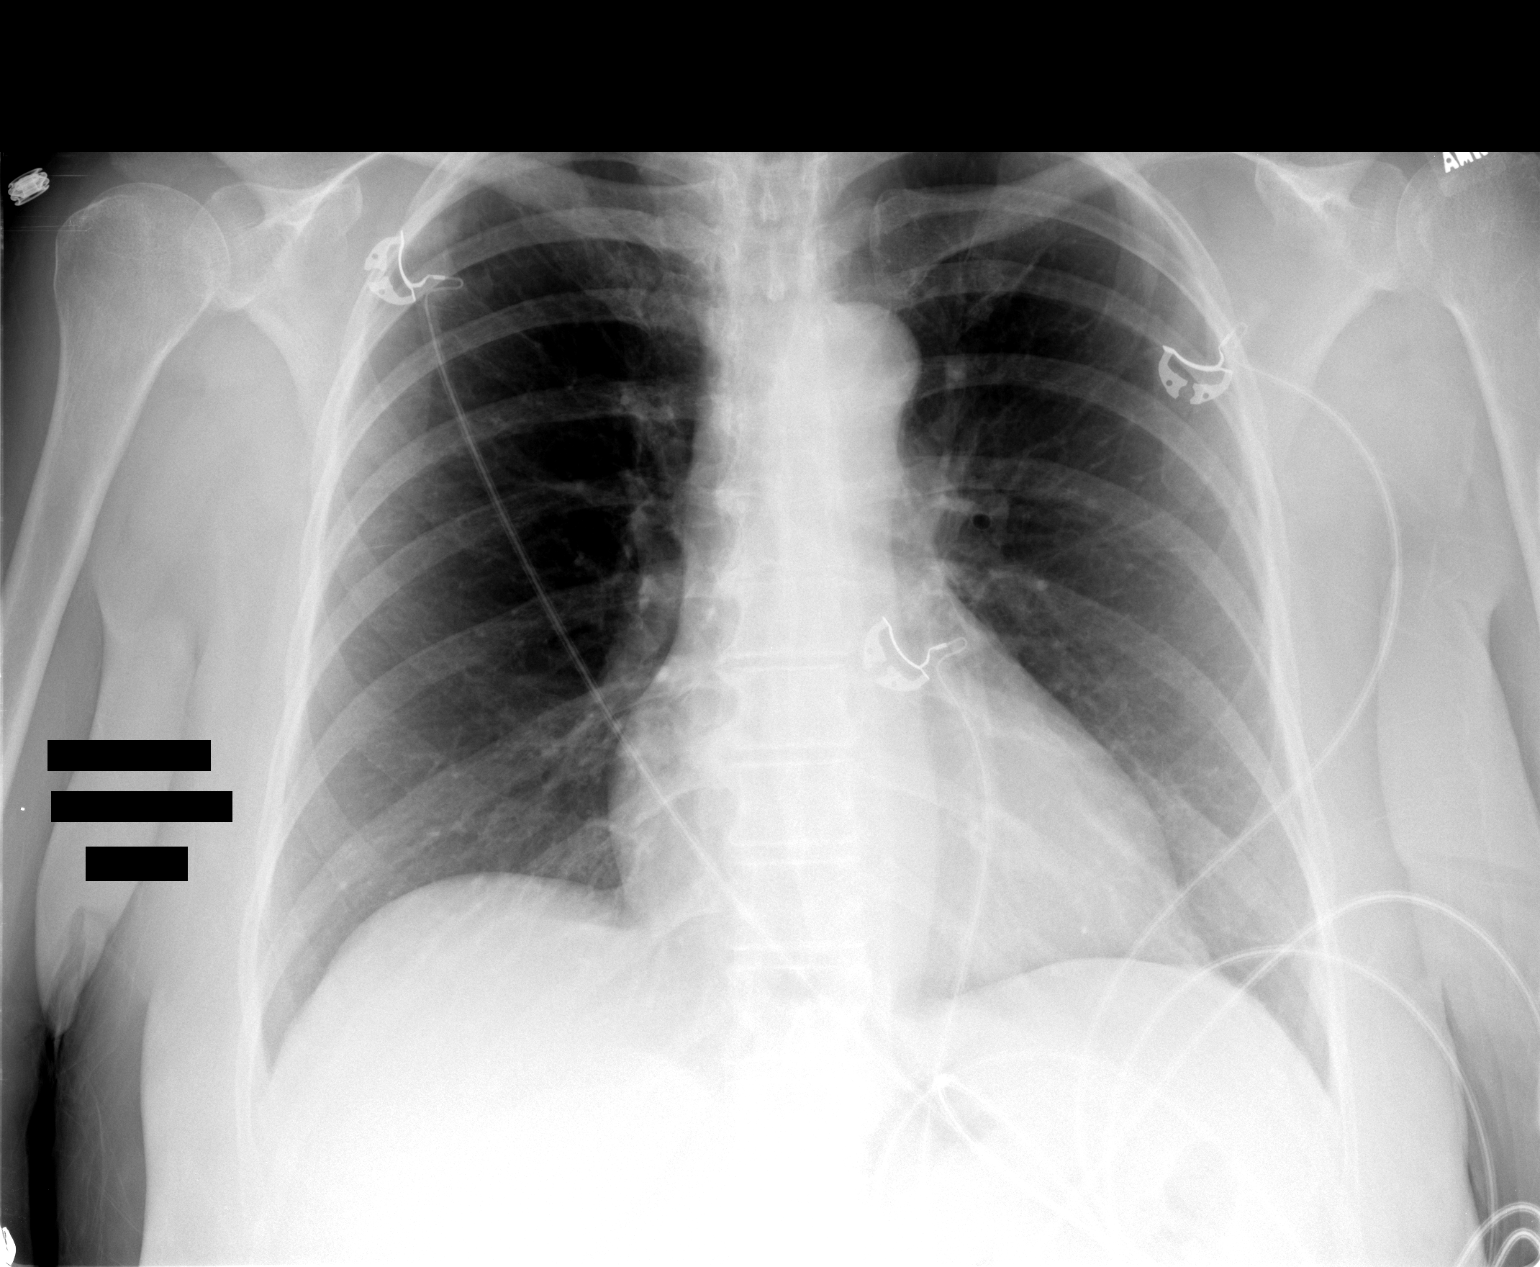

[1 of 1 positions shown; findings below may reference images not displayed]

FINDINGS: Upper normal-size of cardiac silhouette.
Mediastinal contours and pulmonary vascularity normal.
Lungs well expanded and clear.
No pleural effusion or pneumothorax.
No acute osseous findings.
Cardiac monitoring leads project over chest.
IMPRESSION: No acute abnormalities.

## 2012-08-24 ENCOUNTER — Encounter (INDEPENDENT_AMBULATORY_CARE_PROVIDER_SITE_OTHER): Payer: Self-pay | Admitting: *Deleted

## 2012-09-05 ENCOUNTER — Ambulatory Visit (INDEPENDENT_AMBULATORY_CARE_PROVIDER_SITE_OTHER): Payer: Medicare Other | Admitting: Internal Medicine

## 2012-09-13 ENCOUNTER — Other Ambulatory Visit (INDEPENDENT_AMBULATORY_CARE_PROVIDER_SITE_OTHER): Payer: Self-pay | Admitting: Internal Medicine

## 2012-09-25 ENCOUNTER — Ambulatory Visit (INDEPENDENT_AMBULATORY_CARE_PROVIDER_SITE_OTHER): Payer: Medicare Other | Admitting: Internal Medicine

## 2012-10-30 ENCOUNTER — Ambulatory Visit (INDEPENDENT_AMBULATORY_CARE_PROVIDER_SITE_OTHER): Payer: 59 | Admitting: Internal Medicine

## 2012-10-30 ENCOUNTER — Encounter (INDEPENDENT_AMBULATORY_CARE_PROVIDER_SITE_OTHER): Payer: Self-pay | Admitting: Internal Medicine

## 2012-10-30 VITALS — BP 130/72 | HR 68 | Ht 64.0 in | Wt 172.4 lb

## 2012-10-30 DIAGNOSIS — K219 Gastro-esophageal reflux disease without esophagitis: Secondary | ICD-10-CM

## 2012-10-30 DIAGNOSIS — K589 Irritable bowel syndrome without diarrhea: Secondary | ICD-10-CM

## 2012-10-30 NOTE — Patient Instructions (Addendum)
OV in 1 yrs

## 2012-10-30 NOTE — Progress Notes (Signed)
Subjective:     Patient ID: Allison [name], female   DOB: [birth year], 67 y.o.   MRN: [id number]  HPI Here today for follow up of her GERD and IBS.  For the most part she feels good. She lives alone. She has been thru quite a bit. She lost her husband in 2001. Her appetite is good.  She has gained 7 pounds since her last visit in April of 2013. Acid reflux is controlled at this time. If she becomes upset, she will have abdominal cramps. BMs are better. Sometimes she will have loose stools. Usually has one a day.   Last weight 08/23/2011 165 04/12/2012 Colonoscopy; average risk screening Examination performed to cecum.  No evidence of colonic polyps.  Scattered diverticula at sigmoid colon along with few more at transverse colon.  External hemorrhoids and anal papillae.  Review of Systems Current Outpatient Prescriptions  Medication Sig Dispense Refill  . acetaminophen (TYLENOL) 500 MG tablet Take 1,000 mg by mouth every 6 (six) hours as needed.      . bisoprolol-hydrochlorothiazide (ZIAC) 10-6.25 MG per tablet Take 1 tablet by mouth daily.        Marland Kitchen ibuprofen (ADVIL,MOTRIN) 800 MG tablet Take 400 mg by mouth every 8 (eight) hours as needed. pain      . Multiple Vitamins-Minerals (CENTRUM SILVER PO) Take by mouth daily.        . Omega-3 Fatty Acids (FISH OIL) 1200 MG CAPS Take 1,200 mg by mouth 2 (two) times daily.      Marland Kitchen omeprazole (PRILOSEC) 20 MG capsule TAKE ONE (1) CAPSULE EACH DAY  90 capsule  1  . simvastatin (ZOCOR) 20 MG tablet Take 20 mg by mouth at bedtime.        Marland Kitchen omeprazole (PRILOSEC OTC) 20 MG tablet Take 1 tablet (20 mg total) by mouth daily.  90 tablet  3   No current facility-administered medications for this visit.   Past Medical History  Diagnosis Date  . Hypertension   . Irritable bowel syndrome   . Gastroesophageal reflux   . Anxiety    Past Surgical History  Procedure Laterality Date  . Esophagogastroduodenoscopy  08/19/2010  . Esophagogastroduodenoscopy   09/24/2008  . Colonoscopy  08/11/2001  . Colonoscopy  04/12/2012    Procedure: COLONOSCOPY;  Surgeon: Malissa Hippo, MD;  Location: [facility];  Service: Endoscopy;  Laterality: N/A;  1030   Allergies  Allergen Reactions  . Codeine     REACTION: Unknown reaction  . Sulfonamide Derivatives     REACTION: Unknown reaction       Objective:   Physical Exam  Filed Vitals:   10/30/12 1544  BP: 130/72  Pulse: 68  Height: 5\' 4"  (1.626 m)  Weight: 172 lb 6.4 oz (78.2 kg)   Alert and oriented. Skin warm and dry. Oral mucosa is moist.   . Sclera anicteric, conjunctivae is pink. Thyroid not enlarged. No cervical lymphadenopathy. Lungs clear. Heart regular rate and rhythm.  Abdomen is soft. Bowel sounds are positive. No hepatomegaly. No abdominal masses felt. No tenderness.  No edema to lower extremities.       Assessment:   IBS for the most part controlled. If she becomes upset she will have stomach cramps.  GERD controlled at this time.    Plan:    Continue present medications. OV in 1 yr.

## 2013-07-23 ENCOUNTER — Encounter (INDEPENDENT_AMBULATORY_CARE_PROVIDER_SITE_OTHER): Payer: Self-pay | Admitting: *Deleted

## 2013-08-08 ENCOUNTER — Other Ambulatory Visit: Payer: Self-pay | Admitting: Orthopedic Surgery

## 2013-08-22 ENCOUNTER — Other Ambulatory Visit (INDEPENDENT_AMBULATORY_CARE_PROVIDER_SITE_OTHER): Payer: Self-pay | Admitting: Internal Medicine

## 2013-08-22 NOTE — Telephone Encounter (Signed)
Per Dr.Rehman may fill with 3 refills. 

## 2013-09-10 ENCOUNTER — Encounter (HOSPITAL_COMMUNITY): Payer: Self-pay | Admitting: Pharmacy Technician

## 2013-09-11 ENCOUNTER — Encounter (HOSPITAL_COMMUNITY)
Admission: RE | Admit: 2013-09-11 | Discharge: 2013-09-11 | Disposition: A | Discharge status: Home / Self Care | Payer: Medicare Other | Source: Ambulatory Visit | Attending: Specialist | Admitting: Specialist

## 2013-09-11 ENCOUNTER — Other Ambulatory Visit: Payer: Self-pay

## 2013-09-11 ENCOUNTER — Ambulatory Visit (HOSPITAL_COMMUNITY)
Admission: RE | Admit: 2013-09-11 | Discharge: 2013-09-11 | Disposition: A | Discharge status: Home / Self Care | Payer: Medicare Other | Source: Ambulatory Visit | Attending: Specialist | Admitting: Specialist

## 2013-09-11 ENCOUNTER — Encounter (HOSPITAL_COMMUNITY): Payer: Self-pay

## 2013-09-11 ENCOUNTER — Other Ambulatory Visit: Payer: Self-pay | Admitting: Orthopedic Surgery

## 2013-09-11 ENCOUNTER — Ambulatory Visit (HOSPITAL_COMMUNITY)
Admission: RE | Admit: 2013-09-11 | Discharge: 2013-09-11 | Disposition: A | Discharge status: Home / Self Care | Payer: Medicare Other | Source: Ambulatory Visit | Attending: Orthopedic Surgery | Admitting: Orthopedic Surgery

## 2013-09-11 DIAGNOSIS — M1710 Unilateral primary osteoarthritis, unspecified knee: Secondary | ICD-10-CM | POA: Insufficient documentation

## 2013-09-11 DIAGNOSIS — Z01812 Encounter for preprocedural laboratory examination: Secondary | ICD-10-CM | POA: Insufficient documentation

## 2013-09-11 DIAGNOSIS — Z01810 Encounter for preprocedural cardiovascular examination: Secondary | ICD-10-CM | POA: Insufficient documentation

## 2013-09-11 DIAGNOSIS — R9431 Abnormal electrocardiogram [ECG] [EKG]: Secondary | ICD-10-CM | POA: Insufficient documentation

## 2013-09-11 DIAGNOSIS — M25469 Effusion, unspecified knee: Secondary | ICD-10-CM | POA: Insufficient documentation

## 2013-09-11 DIAGNOSIS — Z01818 Encounter for other preprocedural examination: Secondary | ICD-10-CM | POA: Insufficient documentation

## 2013-09-11 HISTORY — DX: Cardiac murmur, unspecified: R01.1

## 2013-09-11 HISTORY — DX: Diverticulosis of intestine, part unspecified, without perforation or abscess without bleeding: K57.90

## 2013-09-11 HISTORY — DX: Pure hypercholesterolemia, unspecified: E78.00

## 2013-09-11 HISTORY — DX: Unspecified osteoarthritis, unspecified site: M19.90

## 2013-09-11 HISTORY — DX: Personal history of other diseases of the digestive system: Z87.19

## 2013-09-11 HISTORY — DX: Unspecified symptoms and signs involving the genitourinary system: R39.9

## 2013-09-11 HISTORY — DX: Unspecified hemorrhoids: K64.9

## 2013-09-11 LAB — BASIC METABOLIC PANEL
BUN: 18 mg/dL (ref 6–23)
CO2: 26 mEq/L (ref 19–32)
CREATININE: 0.64 mg/dL (ref 0.50–1.10)
Calcium: 9.7 mg/dL (ref 8.4–10.5)
Chloride: 101 mEq/L (ref 96–112)
GLUCOSE: 126 mg/dL — AB (ref 70–99)
POTASSIUM: 4.8 meq/L (ref 3.7–5.3)
Sodium: 140 mEq/L (ref 137–147)

## 2013-09-11 LAB — URINALYSIS, ROUTINE W REFLEX MICROSCOPIC
BILIRUBIN URINE: NEGATIVE
GLUCOSE, UA: NEGATIVE mg/dL
Hgb urine dipstick: NEGATIVE
Ketones, ur: NEGATIVE mg/dL
LEUKOCYTES UA: NEGATIVE
NITRITE: NEGATIVE
PH: 5.5 (ref 5.0–8.0)
Protein, ur: NEGATIVE mg/dL
SPECIFIC GRAVITY, URINE: 1.021 (ref 1.005–1.030)
Urobilinogen, UA: 0.2 mg/dL (ref 0.0–1.0)

## 2013-09-11 LAB — SURGICAL PCR SCREEN
MRSA, PCR: NEGATIVE
Staphylococcus aureus: POSITIVE — AB

## 2013-09-11 LAB — CBC
HCT: 40.7 % (ref 36.0–46.0)
HEMOGLOBIN: 13.1 g/dL (ref 12.0–15.0)
MCH: 26.5 pg (ref 26.0–34.0)
MCHC: 32.2 g/dL (ref 30.0–36.0)
MCV: 82.4 fL (ref 78.0–100.0)
Platelets: 261 10*3/uL (ref 150–400)
RBC: 4.94 MIL/uL (ref 3.87–5.11)
RDW: 13.4 % (ref 11.5–15.5)
WBC: 7.7 10*3/uL (ref 4.0–10.5)

## 2013-09-11 LAB — PROTIME-INR
INR: 0.91 (ref 0.00–1.49)
Prothrombin Time: 12.1 seconds (ref 11.6–15.2)

## 2013-09-11 LAB — ABO/RH: ABO/RH(D): O POS

## 2013-09-11 NOTE — H&P (Signed)
Allison [name] DOB: 1945-07-13 Widowed / Language: Allison [name] / Race: White Female  H&P Date: 09/11/13  Chief Complaint: R knee pain  History of Present Illness The patient is a 68 year old female who comes in today for a preoperative History and Physical. The patient is scheduled for a right total knee arthroplasty to be performed by Dr. Johnn Hai, MD at [facility] on Sep 20, 2013. Pt with severe B/L knee DJD, right knee refractory to steroid injection, ice, elevation, medication, relative rest, quad strengthening, activity modifications. 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  Dr. Tonita Cong and the patient mutually agreed to proceed with a total knee replacement. Risks and benefits of the procedure were discussed including stiffness, suboptimal range of motion, persistent pain, infection requiring removal of prosthesis and reinsertion, need for prophylactic antibiotics in the future, for example, dental procedures, possible need for manipulation, revision in the future and also anesthetic complications including DVT, PE, etc. We discussed the perioperative course, time in the hospital, postoperative recovery and the need for elevation to control swelling. We also discussed the predicted range of motion and the probability that squatting and kneeling would be unobtainable in the future. In addition, postoperative anticoagulation was discussed. We have obtained preoperative medical clearance as necessary- PCP DR. McGough. Provided her illustrated handout and discussed it in detail. They will enroll in the total joint replacement educational forum at the hospital.  Past Medical Hx Oophorectomy. bilateral High blood pressure Gastroesophageal Reflux Disease Heart murmur Irritable bowel  syndrome Hypercholesterolemia Chronic Cystitis Diverticulitis Of Colon Anxiety Disorder Osteoarthritis Cataract Hemorrhoids Hiatal Hernia Urinary Tract Infection Scarlet Fever Measles Menopause  Allergies Trazodone CODEINE Codeine Phosphate *ANALGESICS - OPIOID* Sulfa Drugs  Family History Father. Living. 53 y/o, CHF, dementia Mother. Deceased. 5 y/o, COPD Siblings. Deceased. 62 y/o, depression  Social History Tobacco use. Never smoker. never smoker Tobacco / smoke exposure. no Number of flights of stairs before winded. greater than 5 Marital status. widowed Illicit drug use. no Exercise. Exercises rarely Living situation. live alone, 2 story home, has bedroom downstairs, 3 steps to enter Pain Contract. no Copy of Drug/Alcohol Rehab (Previously). no Children. 2 Drug/Alcohol Rehab (Currently). no Current work status. retired. [place] Alcohol use. never consumed alcohol Advance Directives. none Post-Surgical Plans. SNF- [facility], then home with HHPT  Medication History Simvastatin ( Oral) Specific dose unknown - Active. Fish Oil Active. Baby Aspirin (81MG  Tablet Chewable, Oral) Active. Ziac ( Oral) Specific dose unknown - Active. Multivitamins ( Oral) Specific dose unknown - Active. PriLOSEC ( Oral) Specific dose unknown - Active. Omeprazole ( Oral) Specific dose unknown - Active. Bisoprolol-Hydrochlorothiazide (10-6.25MG  Tablet, Oral) Active. Glucosamine/Chondroitin/MSM ( Oral) Specific dose unknown - Active. Ibuprofen ( Oral) Specific dose unknown - Active. Tylenol ( Oral) Specific dose unknown - Active. Medications Reconciled.  Past Surgical History Arthroscopy of Knee. left Gallbladder Surgery. laporoscopic Hysterectomy. complete (non-cancerous) Cesarean Delivery. 2 times  Review of Systems General:Not Present- Chills, Fever, Night Sweats, Fatigue, Weight Gain, Weight Loss and Memory Loss. Skin:Not  Present- Hives, Itching, Rash, Eczema and Lesions. HEENT:Not Present- Tinnitus, Headache, Double Vision, Visual Loss, Hearing Loss and Dentures. Respiratory:Not Present- Shortness of breath with exertion, Shortness of breath at rest, Allergies, Coughing up blood and Chronic Cough. Cardiovascular:Not Present- Chest Pain, Racing/skipping heartbeats, Difficulty Breathing Lying Down,  Murmur, Swelling and Palpitations. Gastrointestinal:Not Present- Bloody Stool, Heartburn, Abdominal Pain, Vomiting, Nausea, Constipation, Diarrhea, Difficulty Swallowing, Jaundice and Loss of appetitie. Female Genitourinary:Not Present- Blood in Urine, Urinary frequency, Weak urinary stream, Discharge, Flank Pain, Incontinence, Painful Urination, Urgency, Urinary Retention and Urinating at Night. Musculoskeletal:Present- Joint Swelling, Joint Pain, Back Pain and Morning Stiffness. Not Present- Muscle Weakness, Muscle Pain and Spasms. Neurological:Not Present- Tremor, Dizziness, Blackout spells, Paralysis, Difficulty with balance and Weakness. Psychiatric:Not Present- Insomnia.  Physical Exam The physical exam findings are as follows:  General Mental Status - Alert, cooperative and good historian. General Appearance- pleasant, Anxious and appears comfortable. Not in acute distress. Orientation- Oriented X3. Build & Nutrition- Well nourished and Well developed. Gait- Use of assistive device (cane) and Antalgic.  Head and Neck Head- normocephalic, atraumatic . Neck Global Assessment- supple. no bruit auscultated on the right and no bruit auscultated on the left.  Eye Pupil- Bilateral- Regular and Round. Motion- Bilateral- EOMI.  Chest and Lung Exam Auscultation: Breath sounds:- clear at anterior chest wall and - clear at posterior chest wall. Adventitious sounds:- No Adventitious sounds.  Cardiovascular Auscultation:Rhythm- Regular rate and rhythm. Heart Sounds- S1 WNL and S2  WNL. Murmurs & Other Heart Sounds:Auscultation of the heart reveals - No Murmurs.  Abdomen Palpation/Percussion:Tenderness- Abdomen is non-tender to palpation. Rigidity (guarding)- Abdomen is soft. Auscultation:Auscultation of the abdomen reveals - Bowel sounds normal.   Female Genitourinary Not done, not pertinent to present illness  Musculoskeletal R knee tender to palpation medial and lateral jointlines. + Effusion, mild. ROM -2 to 110 degrees. + PF pain. + PF crepitus. Quad weakness 4+/5. Pulses 2+. No calf pain or sign of DVT. Varus thrust, antalgic gait.  Imaging standing knee xrays with severe end-stage bone-on-bone arthrosis, medial and PF most significant, with lateral spurring. Varus deformity.  Assessment & Plan R knee DJD  Pt with B/L knee DJD, scheduled for R TKA by Dr. Tonita Cong on 5/14. She has been cleared by her PCP, Dr. Orson Ape. She is scheduled for [facility] pre-op 5/5. We reviewed her medications, will hold NSAIDs, ASA, supplements accordingly. Will remain NPO after MN night before surgery. We again discussed total knee replacement. Discussed the procedure itself as well as risks, complications and alternatives, including but not limited to DVT, PE, infx, bleeding, failure of procedure, need for secondary procedure including manipulation, nerve injury, ongoing pain/symptoms, anesthesia risk, even stroke or death. Also discussed typical post-op protocols, activity restrictions, need for PT, flexion/extension exercises, time out of work. Discussed need for DVT ppx post-op with Xarelto then ASA per protocol. Discussed dental ppx. Also discussed limitations post-operatively such as kneeling and squatting. All questions were answered. Patient desires to proceed with surgery as scheduled.  Plan SNF initially post-op as she lives alone then home with HHPT, then outpt PT. Given her hx of PONV recommend IV tylenol and exparil to help with pain mgmt. She will follow up 10-14 days post-op  for suture removal and xrays and will call with any questions or concerns in the interim.  Plan Right total knee replacement  Signed electronically by Cecilie Kicks, PA-C for Dr. Tonita Cong

## 2013-09-11 NOTE — Patient Instructions (Addendum)
Allison [name]  09/11/2013   Your procedure is scheduled on: 5-14  -2015  Report to [facility] at     [place]   AM ..  Call this number if you have problems the morning of surgery: [phone]  Or Presurgical Testing [phone](Nicholas Trompeter) For Living Will and/or Health Care Power Attorney Forms: please provide copy for your medical record,may bring AM of surgery(Forms should be already notarized -we do not provide this service).  Remember: Follow any bowel prep instructions per MD office. For Cpap use: Bring mask and tubing only.   Do not eat food:After Midnight.    Take these medicines the morning of surgery with A SIP OF WATER: Tylenol. Omeprazole. Short Stay will give Bisoprolol 10 mg orally on arrival AM of.   Do not wear jewelry, make-up or nail polish.  Do not wear lotions, powders, or perfumes. You may wear deodorant.  Do not shave 48 hours(2 days) prior to first CHG shower(legs and under arms).(Shaving face and neck okay.)  Do not bring valuables to the hospital.(Hospital is not responsible for lost valuables).  Contacts, dentures or removable bridgework, body piercing, hair pins may not be worn into surgery.  Leave suitcase in the car. After surgery it may be brought to your room.  For patients admitted to the hospital, checkout time is 11:00 AM the day of discharge.(Restricted visitors-Any Persons displaying flu-like symptoms or illness).    Patients discharged the day of surgery will not be allowed to drive home. Must have responsible person with you x 24 hours once discharged.  Name and phone number of your driver: son Allison [name] [phone] cell  Special Instructions: CHG(Chlorhedine 4%-"Hibiclens","Betasept","Aplicare") Shower Use Special Wash: see special instructions.(avoid face and genitals)   Please read over the following fact sheets that you were given: MRSA Information, Blood Transfusion fact sheet, Incentive Spirometry Instruction.  Remember  : Type/Screen "Blue armbands" - may not be removed once applied(would result in being retested AM of surgery, if removed).  Failure to follow these instructions may result in Cancellation of your surgery.   Patient signature_______________________________________________________

## 2013-09-11 NOTE — Pre-Procedure Instructions (Signed)
09-11-13 EKG/ CXR done today. Right Knee Xray done per order.

## 2013-09-12 ENCOUNTER — Other Ambulatory Visit: Payer: Self-pay | Admitting: Orthopedic Surgery

## 2013-09-12 NOTE — Progress Notes (Signed)
09-12-13 1715 PCR screen positive for Staph aureus- Rx. Called to [facility] [phone]. Pt. Left voice message to pick up and use as directed. W. Floy Sabina

## 2013-09-20 ENCOUNTER — Encounter (HOSPITAL_COMMUNITY): Payer: Medicare Other | Admitting: Certified Registered Nurse Anesthetist

## 2013-09-20 ENCOUNTER — Inpatient Hospital Stay (HOSPITAL_COMMUNITY): Payer: Medicare Other | Admitting: Certified Registered Nurse Anesthetist

## 2013-09-20 ENCOUNTER — Encounter (HOSPITAL_COMMUNITY): Payer: Self-pay | Admitting: *Deleted

## 2013-09-20 ENCOUNTER — Inpatient Hospital Stay (HOSPITAL_COMMUNITY)
Admission: RE | Admit: 2013-09-20 | Discharge: 2013-09-23 | DRG: 470 | Disposition: A | Discharge status: Skilled Nursing Facility | Payer: Medicare Other | Source: Ambulatory Visit | Attending: Specialist | Admitting: Specialist

## 2013-09-20 ENCOUNTER — Inpatient Hospital Stay (HOSPITAL_COMMUNITY): Payer: Medicare Other

## 2013-09-20 ENCOUNTER — Encounter (HOSPITAL_COMMUNITY)
Admission: RE | Disposition: A | Discharge status: Skilled Nursing Facility | Payer: Self-pay | Source: Ambulatory Visit | Attending: Specialist

## 2013-09-20 DIAGNOSIS — R011 Cardiac murmur, unspecified: Secondary | ICD-10-CM | POA: Diagnosis present

## 2013-09-20 DIAGNOSIS — K5900 Constipation, unspecified: Secondary | ICD-10-CM | POA: Diagnosis not present

## 2013-09-20 DIAGNOSIS — T40605A Adverse effect of unspecified narcotics, initial encounter: Secondary | ICD-10-CM | POA: Diagnosis not present

## 2013-09-20 DIAGNOSIS — M1710 Unilateral primary osteoarthritis, unspecified knee: Principal | ICD-10-CM | POA: Diagnosis present

## 2013-09-20 DIAGNOSIS — Z8719 Personal history of other diseases of the digestive system: Secondary | ICD-10-CM

## 2013-09-20 DIAGNOSIS — M1711 Unilateral primary osteoarthritis, right knee: Secondary | ICD-10-CM

## 2013-09-20 DIAGNOSIS — Z87440 Personal history of urinary (tract) infections: Secondary | ICD-10-CM

## 2013-09-20 DIAGNOSIS — Z882 Allergy status to sulfonamides status: Secondary | ICD-10-CM

## 2013-09-20 DIAGNOSIS — Z888 Allergy status to other drugs, medicaments and biological substances status: Secondary | ICD-10-CM

## 2013-09-20 DIAGNOSIS — K219 Gastro-esophageal reflux disease without esophagitis: Secondary | ICD-10-CM | POA: Diagnosis present

## 2013-09-20 DIAGNOSIS — E7800 Pure hypercholesterolemia, unspecified: Secondary | ICD-10-CM | POA: Diagnosis present

## 2013-09-20 DIAGNOSIS — I10 Essential (primary) hypertension: Secondary | ICD-10-CM | POA: Diagnosis present

## 2013-09-20 DIAGNOSIS — R112 Nausea with vomiting, unspecified: Secondary | ICD-10-CM | POA: Diagnosis not present

## 2013-09-20 DIAGNOSIS — F411 Generalized anxiety disorder: Secondary | ICD-10-CM | POA: Diagnosis present

## 2013-09-20 DIAGNOSIS — Z79899 Other long term (current) drug therapy: Secondary | ICD-10-CM

## 2013-09-20 DIAGNOSIS — Z7982 Long term (current) use of aspirin: Secondary | ICD-10-CM

## 2013-09-20 HISTORY — PX: TOTAL KNEE ARTHROPLASTY: SHX125

## 2013-09-20 LAB — TYPE AND SCREEN
ABO/RH(D): O POS
Antibody Screen: NEGATIVE

## 2013-09-20 SURGERY — ARTHROPLASTY, KNEE, TOTAL
Anesthesia: Spinal | Site: Knee | Laterality: Right

## 2013-09-20 MED ORDER — PANTOPRAZOLE SODIUM 40 MG PO TBEC: 40.0000 mg | DELAYED_RELEASE_TABLET | Freq: Every day | ORAL | Status: DC

## 2013-09-20 MED ORDER — PROPOFOL 10 MG/ML IV BOLUS
INTRAVENOUS | Status: AC
  Filled 2013-09-20: qty 20

## 2013-09-20 MED ORDER — TRAMADOL HCL 50 MG PO TABS: 50.0000 mg | ORAL_TABLET | Freq: Four times a day (QID) | ORAL | Status: DC | PRN

## 2013-09-20 MED ORDER — TRAMADOL HCL 50 MG PO TABS
50.0000 mg | ORAL_TABLET | Freq: Four times a day (QID) | ORAL | Status: DC | PRN
  Administered 2013-09-21 (×2): 50 mg via ORAL
  Filled 2013-09-20 (×2): qty 1

## 2013-09-20 MED ORDER — DOCUSATE SODIUM 100 MG PO CAPS
100.0000 mg | ORAL_CAPSULE | Freq: Two times a day (BID) | ORAL | Status: DC
  Administered 2013-09-20 – 2013-09-23 (×6): 100 mg via ORAL

## 2013-09-20 MED ORDER — ADULT MULTIVITAMIN W/MINERALS CH
1.0000 | ORAL_TABLET | Freq: Every day | ORAL | Status: DC
  Administered 2013-09-21 – 2013-09-22 (×2): 1 via ORAL
  Filled 2013-09-20 (×3): qty 1

## 2013-09-20 MED ORDER — MIDAZOLAM HCL 5 MG/5ML IJ SOLN
INTRAMUSCULAR | Status: DC | PRN
  Administered 2013-09-20 (×2): 1 mg via INTRAVENOUS

## 2013-09-20 MED ORDER — METOCLOPRAMIDE HCL 5 MG/ML IJ SOLN
5.0000 mg | Freq: Three times a day (TID) | INTRAMUSCULAR | Status: DC | PRN
  Administered 2013-09-20: 10 mg via INTRAVENOUS
  Filled 2013-09-20: qty 2

## 2013-09-20 MED ORDER — METHOCARBAMOL 1000 MG/10ML IJ SOLN
500.0000 mg | Freq: Four times a day (QID) | INTRAVENOUS | Status: DC | PRN
  Administered 2013-09-20: 500 mg via INTRAVENOUS
  Filled 2013-09-20 (×2): qty 5

## 2013-09-20 MED ORDER — SODIUM CHLORIDE 0.9 % IJ SOLN
INTRAMUSCULAR | Status: AC
  Filled 2013-09-20: qty 10

## 2013-09-20 MED ORDER — ONDANSETRON HCL 4 MG/2ML IJ SOLN
4.0000 mg | Freq: Four times a day (QID) | INTRAMUSCULAR | Status: DC | PRN
  Administered 2013-09-20 – 2013-09-22 (×2): 4 mg via INTRAVENOUS
  Filled 2013-09-20 (×3): qty 2

## 2013-09-20 MED ORDER — MENTHOL 3 MG MT LOZG
1.0000 | LOZENGE | OROMUCOSAL | Status: DC | PRN
  Filled 2013-09-20: qty 9

## 2013-09-20 MED ORDER — MEPERIDINE HCL 50 MG/ML IJ SOLN
6.2500 mg | INTRAMUSCULAR | Status: DC | PRN
Start: 2013-09-20 — End: 2013-09-20

## 2013-09-20 MED ORDER — ONDANSETRON HCL 4 MG/2ML IJ SOLN
INTRAMUSCULAR | Status: DC | PRN
  Administered 2013-09-20 (×2): 2 mg via INTRAVENOUS

## 2013-09-20 MED ORDER — METHOCARBAMOL 500 MG PO TABS
500.0000 mg | ORAL_TABLET | Freq: Four times a day (QID) | ORAL | Status: DC | PRN
  Administered 2013-09-22: 500 mg via ORAL
  Filled 2013-09-20 (×3): qty 1

## 2013-09-20 MED ORDER — METOCLOPRAMIDE HCL 10 MG PO TABS
5.0000 mg | ORAL_TABLET | Freq: Three times a day (TID) | ORAL | Status: DC | PRN
  Administered 2013-09-22: 10 mg via ORAL
  Filled 2013-09-20: qty 1

## 2013-09-20 MED ORDER — ONDANSETRON HCL 8 MG PO TABS: 8.0000 mg | ORAL_TABLET | Freq: Three times a day (TID) | ORAL | Status: DC | PRN

## 2013-09-20 MED ORDER — CEFAZOLIN SODIUM-DEXTROSE 2-3 GM-% IV SOLR
2.0000 g | Freq: Four times a day (QID) | INTRAVENOUS | Status: AC
  Administered 2013-09-20 – 2013-09-21 (×3): 2 g via INTRAVENOUS
  Filled 2013-09-20 (×3): qty 50

## 2013-09-20 MED ORDER — LACTATED RINGERS IV SOLN
INTRAVENOUS | Status: DC | PRN
  Administered 2013-09-20 (×3): via INTRAVENOUS

## 2013-09-20 MED ORDER — MIDAZOLAM HCL 2 MG/2ML IJ SOLN
INTRAMUSCULAR | Status: AC
  Filled 2013-09-20: qty 2

## 2013-09-20 MED ORDER — CEFAZOLIN SODIUM-DEXTROSE 2-3 GM-% IV SOLR
2.0000 g | INTRAVENOUS | Status: AC
  Administered 2013-09-20: 2 g via INTRAVENOUS

## 2013-09-20 MED ORDER — PANTOPRAZOLE SODIUM 40 MG IV SOLR
40.0000 mg | Freq: Two times a day (BID) | INTRAVENOUS | Status: DC
  Administered 2013-09-20 – 2013-09-21 (×3): 40 mg via INTRAVENOUS
  Filled 2013-09-20 (×6): qty 40

## 2013-09-20 MED ORDER — BISOPROLOL FUMARATE 10 MG PO TABS
10.0000 mg | ORAL_TABLET | Freq: Once | ORAL | Status: AC
  Administered 2013-09-20: 10 mg via ORAL
  Filled 2013-09-20 (×2): qty 1

## 2013-09-20 MED ORDER — BISOPROLOL-HYDROCHLOROTHIAZIDE 10-6.25 MG PO TABS
1.0000 | ORAL_TABLET | Freq: Every morning | ORAL | Status: DC
  Administered 2013-09-21 – 2013-09-23 (×3): 1 via ORAL
  Filled 2013-09-20 (×4): qty 1

## 2013-09-20 MED ORDER — ACETAMINOPHEN 650 MG RE SUPP: 650.0000 mg | Freq: Four times a day (QID) | RECTAL | Status: DC | PRN

## 2013-09-20 MED ORDER — BUPIVACAINE IN DEXTROSE 0.75-8.25 % IT SOLN
INTRATHECAL | Status: DC | PRN
  Administered 2013-09-20: 2 mL via INTRATHECAL

## 2013-09-20 MED ORDER — EPHEDRINE SULFATE 50 MG/ML IJ SOLN
INTRAMUSCULAR | Status: AC
  Filled 2013-09-20: qty 1

## 2013-09-20 MED ORDER — PROPOFOL 10 MG/ML IV BOLUS
INTRAVENOUS | Status: AC
Start: 2013-09-20 — End: 2013-09-20
  Filled 2013-09-20: qty 20

## 2013-09-20 MED ORDER — MORPHINE SULFATE 2 MG/ML IJ SOLN: 1.0000 mg | INTRAMUSCULAR | Status: DC | PRN

## 2013-09-20 MED ORDER — LACTATED RINGERS IV SOLN: INTRAVENOUS | Status: DC

## 2013-09-20 MED ORDER — PROPOFOL INFUSION 10 MG/ML OPTIME
INTRAVENOUS | Status: DC | PRN
  Administered 2013-09-20: 120 ug/kg/min via INTRAVENOUS

## 2013-09-20 MED ORDER — ACETAMINOPHEN 325 MG PO TABS
650.0000 mg | ORAL_TABLET | Freq: Four times a day (QID) | ORAL | Status: DC | PRN
Start: 2013-09-20 — End: 2013-09-23
  Administered 2013-09-21 – 2013-09-23 (×5): 650 mg via ORAL
  Filled 2013-09-20 (×5): qty 2

## 2013-09-20 MED ORDER — RIVAROXABAN 10 MG PO TABS: 10.0000 mg | ORAL_TABLET | Freq: Every day | ORAL | Status: DC

## 2013-09-20 MED ORDER — DEXAMETHASONE SODIUM PHOSPHATE 10 MG/ML IJ SOLN
INTRAMUSCULAR | Status: AC
  Filled 2013-09-20: qty 1

## 2013-09-20 MED ORDER — SODIUM CHLORIDE 0.9 % IR SOLN
Status: DC | PRN
  Administered 2013-09-20: 08:00:00

## 2013-09-20 MED ORDER — ACETAMINOPHEN 325 MG PO TABS
650.0000 mg | ORAL_TABLET | Freq: Four times a day (QID) | ORAL | Status: AC
  Administered 2013-09-21 (×2): 650 mg via ORAL
  Filled 2013-09-20 (×2): qty 2

## 2013-09-20 MED ORDER — DOCUSATE SODIUM 100 MG PO CAPS: 100.0000 mg | ORAL_CAPSULE | Freq: Two times a day (BID) | ORAL | Status: DC | PRN

## 2013-09-20 MED ORDER — ATROPINE SULFATE 0.4 MG/ML IJ SOLN
INTRAMUSCULAR | Status: AC
  Filled 2013-09-20: qty 1

## 2013-09-20 MED ORDER — PROMETHAZINE HCL 25 MG/ML IJ SOLN
6.2500 mg | Freq: Four times a day (QID) | INTRAMUSCULAR | Status: DC | PRN
  Administered 2013-09-20: 12.5 mg via INTRAVENOUS
  Filled 2013-09-20: qty 1

## 2013-09-20 MED ORDER — CEFAZOLIN SODIUM-DEXTROSE 2-3 GM-% IV SOLR
INTRAVENOUS | Status: AC
  Filled 2013-09-20: qty 50

## 2013-09-20 MED ORDER — CLINDAMYCIN PHOSPHATE 900 MG/50ML IV SOLN
900.0000 mg | INTRAVENOUS | Status: AC
  Administered 2013-09-20: 900 mg via INTRAVENOUS

## 2013-09-20 MED ORDER — FENTANYL CITRATE 0.05 MG/ML IJ SOLN
INTRAMUSCULAR | Status: AC
  Filled 2013-09-20: qty 5

## 2013-09-20 MED ORDER — SODIUM CHLORIDE 0.9 % IR SOLN
Status: DC | PRN
  Administered 2013-09-20: 1000 mL

## 2013-09-20 MED ORDER — KETOROLAC TROMETHAMINE 30 MG/ML IJ SOLN
30.0000 mg | Freq: Once | INTRAMUSCULAR | Status: AC
  Administered 2013-09-20: 30 mg via INTRAVENOUS
  Filled 2013-09-20: qty 2
  Filled 2013-09-20: qty 1

## 2013-09-20 MED ORDER — DEXAMETHASONE SODIUM PHOSPHATE 4 MG/ML IJ SOLN
INTRAMUSCULAR | Status: DC | PRN
  Administered 2013-09-20: 10 mg via INTRAVENOUS

## 2013-09-20 MED ORDER — HYDROMORPHONE HCL PF 1 MG/ML IJ SOLN
1.0000 mg | INTRAMUSCULAR | Status: DC | PRN
  Administered 2013-09-20: 1 mg via INTRAVENOUS
  Filled 2013-09-20: qty 1

## 2013-09-20 MED ORDER — PHENOL 1.4 % MT LIQD: 1.0000 | OROMUCOSAL | Status: DC | PRN

## 2013-09-20 MED ORDER — ONDANSETRON HCL 4 MG PO TABS
4.0000 mg | ORAL_TABLET | Freq: Four times a day (QID) | ORAL | Status: DC | PRN
  Administered 2013-09-23: 4 mg via ORAL
  Filled 2013-09-20 (×2): qty 1

## 2013-09-20 MED ORDER — OXYCODONE HCL 5 MG PO TABS: 5.0000 mg | ORAL_TABLET | ORAL | Status: DC | PRN

## 2013-09-20 MED ORDER — ACETAMINOPHEN 10 MG/ML IV SOLN
1000.0000 mg | INTRAVENOUS | Status: AC
  Administered 2013-09-20: 1000 mg via INTRAVENOUS
  Filled 2013-09-20: qty 100

## 2013-09-20 MED ORDER — BUPIVACAINE LIPOSOME 1.3 % IJ SUSP
20.0000 mL | Freq: Once | INTRAMUSCULAR | Status: AC
  Administered 2013-09-20: 20 mL
  Filled 2013-09-20: qty 20

## 2013-09-20 MED ORDER — PROMETHAZINE HCL 25 MG/ML IJ SOLN: 6.2500 mg | INTRAMUSCULAR | Status: DC | PRN

## 2013-09-20 MED ORDER — ONDANSETRON HCL 4 MG/2ML IJ SOLN
INTRAMUSCULAR | Status: AC
  Filled 2013-09-20: qty 2

## 2013-09-20 MED ORDER — SODIUM CHLORIDE 0.45 % IV SOLN
INTRAVENOUS | Status: AC
  Administered 2013-09-20 – 2013-09-21 (×3): via INTRAVENOUS

## 2013-09-20 MED ORDER — RIVAROXABAN 10 MG PO TABS
10.0000 mg | ORAL_TABLET | Freq: Every day | ORAL | Status: DC
  Administered 2013-09-21 – 2013-09-23 (×3): 10 mg via ORAL
  Filled 2013-09-20 (×4): qty 1

## 2013-09-20 MED ORDER — HYDROMORPHONE HCL PF 1 MG/ML IJ SOLN: 0.2500 mg | INTRAMUSCULAR | Status: DC | PRN

## 2013-09-20 MED ORDER — HYDROMORPHONE HCL PF 1 MG/ML IJ SOLN: INTRAMUSCULAR | Status: DC | PRN

## 2013-09-20 MED ORDER — CLINDAMYCIN PHOSPHATE 900 MG/50ML IV SOLN
INTRAVENOUS | Status: AC
  Filled 2013-09-20: qty 50

## 2013-09-20 MED ORDER — KETOROLAC TROMETHAMINE 30 MG/ML IJ SOLN
30.0000 mg | Freq: Once | INTRAMUSCULAR | Status: AC
  Administered 2013-09-21: 30 mg via INTRAVENOUS
  Filled 2013-09-20: qty 1
  Filled 2013-09-20: qty 2

## 2013-09-20 SURGICAL SUPPLY — 68 items
BAG SPEC THK2 15X12 ZIP CLS (MISCELLANEOUS)
BAG ZIPLOCK 12X15 (MISCELLANEOUS) ×1 IMPLANT
BANDAGE ELASTIC 4 VELCRO ST LF (GAUZE/BANDAGES/DRESSINGS) ×3 IMPLANT
BANDAGE ELASTIC 6 VELCRO ST LF (GAUZE/BANDAGES/DRESSINGS) ×3 IMPLANT
BANDAGE ESMARK 6X9 LF (GAUZE/BANDAGES/DRESSINGS) ×1 IMPLANT
BLADE SAG 18X100X1.27 (BLADE) ×3 IMPLANT
BLADE SAW SGTL 13.0X1.19X90.0M (BLADE) ×3 IMPLANT
BNDG CMPR 9X6 STRL LF SNTH (GAUZE/BANDAGES/DRESSINGS) ×1
BNDG ESMARK 6X9 LF (GAUZE/BANDAGES/DRESSINGS) ×3
CAPT RP KNEE ×2 IMPLANT
CEMENT HV SMART SET (Cement) ×6 IMPLANT
CLOSURE WOUND 1/2 X4 (GAUZE/BANDAGES/DRESSINGS) ×1
CLOTH 2% CHLOROHEXIDINE 3PK (PERSONAL CARE ITEMS) ×3 IMPLANT
CUFF TOURN SGL QUICK 34 (TOURNIQUET CUFF) ×3
CUFF TRNQT CYL 34X4X40X1 (TOURNIQUET CUFF) ×1 IMPLANT
DRAPE LG THREE QUARTER DISP (DRAPES) ×5 IMPLANT
DRAPE ORTHO SPLIT 77X108 STRL (DRAPES) ×6
DRAPE POUCH INSTRU U-SHP 10X18 (DRAPES) ×3 IMPLANT
DRAPE SURG ORHT 6 SPLT 77X108 (DRAPES) ×2 IMPLANT
DRAPE U-SHAPE 47X51 STRL (DRAPES) ×3 IMPLANT
DRSG ADAPTIC 3X8 NADH LF (GAUZE/BANDAGES/DRESSINGS) IMPLANT
DRSG AQUACEL AG ADV 3.5X10 (GAUZE/BANDAGES/DRESSINGS) ×2 IMPLANT
DRSG PAD ABDOMINAL 8X10 ST (GAUZE/BANDAGES/DRESSINGS) IMPLANT
DRSG TEGADERM 4X4.75 (GAUZE/BANDAGES/DRESSINGS) ×2 IMPLANT
DURAPREP 26ML APPLICATOR (WOUND CARE) ×3 IMPLANT
ELECT REM PT RETURN 9FT ADLT (ELECTROSURGICAL) ×3
ELECTRODE REM PT RTRN 9FT ADLT (ELECTROSURGICAL) ×1 IMPLANT
EVACUATOR 1/8 PVC DRAIN (DRAIN) ×3 IMPLANT
FACESHIELD WRAPAROUND (MASK) ×15 IMPLANT
FACESHIELD WRAPAROUND OR TEAM (MASK) ×5 IMPLANT
GAUZE SPONGE 2X2 8PLY STRL LF (GAUZE/BANDAGES/DRESSINGS) IMPLANT
GLOVE BIOGEL PI IND STRL 7.5 (GLOVE) ×1 IMPLANT
GLOVE BIOGEL PI IND STRL 8 (GLOVE) ×1 IMPLANT
GLOVE BIOGEL PI INDICATOR 7.5 (GLOVE) ×2
GLOVE BIOGEL PI INDICATOR 8 (GLOVE) ×2
GLOVE SURG SS PI 7.5 STRL IVOR (GLOVE) ×3 IMPLANT
GLOVE SURG SS PI 8.0 STRL IVOR (GLOVE) ×6 IMPLANT
GOWN STRL REUS W/TWL XL LVL3 (GOWN DISPOSABLE) ×6 IMPLANT
HANDPIECE INTERPULSE COAX TIP (DISPOSABLE) ×3
IMMOBILIZER KNEE 20 (SOFTGOODS) ×3 IMPLANT
KIT BASIN OR (CUSTOM PROCEDURE TRAY) ×3 IMPLANT
MANIFOLD NEPTUNE II (INSTRUMENTS) ×3 IMPLANT
NEEDLE HYPO 22GX1.5 SAFETY (NEEDLE) ×3 IMPLANT
NS IRRIG 1000ML POUR BTL (IV SOLUTION) IMPLANT
PACK TOTAL JOINT (CUSTOM PROCEDURE TRAY) ×3 IMPLANT
PADDING CAST COTTON 6X4 STRL (CAST SUPPLIES) IMPLANT
POSITIONER SURGICAL ARM (MISCELLANEOUS) ×3 IMPLANT
SET HNDPC FAN SPRY TIP SCT (DISPOSABLE) ×1 IMPLANT
SPONGE GAUZE 2X2 STER 10/PKG (GAUZE/BANDAGES/DRESSINGS) ×2
SPONGE GAUZE 4X4 12PLY (GAUZE/BANDAGES/DRESSINGS) IMPLANT
SPONGE SURGIFOAM ABS GEL 100 (HEMOSTASIS) IMPLANT
STAPLER VISISTAT (STAPLE) IMPLANT
STRIP CLOSURE SKIN 1/2X4 (GAUZE/BANDAGES/DRESSINGS) ×1 IMPLANT
SUCTION FRAZIER 12FR DISP (SUCTIONS) ×3 IMPLANT
SUT BONE WAX W31G (SUTURE) IMPLANT
SUT MNCRL AB 4-0 PS2 18 (SUTURE) ×2 IMPLANT
SUT VIC AB 1 CT1 27 (SUTURE) ×6
SUT VIC AB 1 CT1 27XBRD ANTBC (SUTURE) ×1 IMPLANT
SUT VIC AB 2-0 CT1 27 (SUTURE) ×9
SUT VIC AB 2-0 CT1 TAPERPNT 27 (SUTURE) ×3 IMPLANT
SUT VLOC 180 0 24IN GS25 (SUTURE) ×3 IMPLANT
SYR 20CC LL (SYRINGE) ×3 IMPLANT
TOWEL OR 17X26 10 PK STRL BLUE (TOWEL DISPOSABLE) ×3 IMPLANT
TOWEL OR NON WOVEN STRL DISP B (DISPOSABLE) ×2 IMPLANT
TOWER CARTRIDGE SMART MIX (DISPOSABLE) ×3 IMPLANT
TRAY FOLEY CATH 14FRSI W/METER (CATHETERS) ×3 IMPLANT
WATER STERILE IRR 1500ML POUR (IV SOLUTION) ×4 IMPLANT
WRAP KNEE MAXI GEL POST OP (GAUZE/BANDAGES/DRESSINGS) ×3 IMPLANT

## 2013-09-20 NOTE — Interval H&P Note (Signed)
History and Physical Interval Note:  09/20/2013 7:29 AM  Allison [name]  has presented today for surgery, with the diagnosis of RIGHT KNEE Degenerative joint disease   The various methods of treatment have been discussed with the patient and family. After consideration of risks, benefits and other options for treatment, the patient has consented to  Procedure(s): RIGHT TOTAL KNEE REPLACEMENT (Right) as a surgical intervention .  The patient's history has been reviewed, patient examined, no change in status, stable for surgery.  I have reviewed the patient's chart and labs.  Questions were answered to the patient's satisfaction.     Allison [name]

## 2013-09-20 NOTE — Progress Notes (Signed)
Utilization review completed.  

## 2013-09-20 NOTE — Progress Notes (Signed)
Clinical Social Work Department CLINICAL SOCIAL WORK PLACEMENT NOTE 09/20/2013  Patient:  Allison [name], Allison [name]  Account Number:  [id number] Admit date:  09/20/2013  Clinical Social Worker:  Sindy Messing, LCSW  Date/time:  09/20/2013 12:15 PM  Clinical Social Work is seeking post-discharge placement for this patient at the following level of care:   SKILLED NURSING   (*CSW will update this form in Epic as items are completed)   09/20/2013  Patient/family provided with [place] Department of Clinical Social Work's list of facilities offering this level of care within the geographic area requested by the patient (or if unable, by the patient's family).  09/20/2013  Patient/family informed of their freedom to choose among providers that offer the needed level of care, that participate in Medicare, Medicaid or managed care program needed by the patient, have an available bed and are willing to accept the patient.  09/20/2013  Patient/family informed of [facility]' ownership interest in [facility], as well as of the fact that they are under no obligation to receive care at this facility.  PASARR submitted to EDS on 09/20/2013 PASARR number received from [employer] on 09/20/2013  FL2 transmitted to all facilities in geographic area requested by pt/family on  09/20/2013 FL2 transmitted to all facilities within larger geographic area on   Patient informed that his/her managed care company has contracts with or will negotiate with  certain facilities, including the following:     Patient/family informed of bed offers received:   Patient chooses bed at  Physician recommends and patient chooses bed at    Patient to be transferred to  on   Patient to be transferred to facility by   The following physician request were entered in Epic:   Additional Comments:

## 2013-09-20 NOTE — Brief Op Note (Signed)
09/20/2013  9:49 AM  PATIENT:  Allison [name]  68 y.o. female  PRE-OPERATIVE DIAGNOSIS:  RIGHT KNEE Degenerative joint disease   POST-OPERATIVE DIAGNOSIS:  RIGHT KNEE Degenerative joint disease   PROCEDURE:  Procedure(s): RIGHT TOTAL KNEE REPLACEMENT (Right)  SURGEON:  Surgeon(s) and Role:    * Johnn Hai, MD - Primary  PHYSICIAN ASSISTANT:   ASSISTANTS: [place]   ANESTHESIA:   general  EBL:  Total I/O In: 1000 [I.V.:1000] Out: 200 [Urine:200]  BLOOD ADMINISTERED:none  DRAINS: none and (1) Hemovact drain(s) in the 1 with  Suction Open   LOCAL MEDICATIONS USED:  MARCAINE     SPECIMEN:  No Specimen  DISPOSITION OF SPECIMEN:  N/A  COUNTS:  YES  TOURNIQUET:  * Missing tourniquet times found for documented tourniquets in log:  [id number] *  DICTATION: .Other Dictation: Dictation Number [id number]  PLAN OF CARE: Admit to inpatient   PATIENT DISPOSITION:  PACU - hemodynamically stable.   Delay start of Pharmacological VTE agent (>24hrs) due to surgical blood loss or risk of bleeding: no

## 2013-09-20 NOTE — Anesthesia Procedure Notes (Signed)
Spinal  Patient location during procedure: OR Start time: 09/20/2013 7:46 AM End time: 09/20/2013 7:52 AM Staffing Anesthesiologist: Nolon Nations R Performed by: anesthesiologist  Preanesthetic Checklist Completed: patient identified, site marked, surgical consent, pre-op evaluation, timeout performed, IV checked, risks and benefits discussed and monitors and equipment checked Spinal Block Patient position: sitting Prep: Betadine Patient monitoring: heart rate, continuous pulse ox and blood pressure Approach: right paramedian Location: L2-3 Injection technique: single-shot Needle Needle type: Quincke  Needle gauge: 22 G Needle length: 9 cm Assessment Sensory level: T8 Additional Notes Expiration date of kit checked and confirmed. Patient tolerated procedure well, without complications.

## 2013-09-20 NOTE — Anesthesia Preprocedure Evaluation (Addendum)
Anesthesia Evaluation  Patient identified by MRN, date of birth, ID band Patient awake    Reviewed: Allergy & Precautions, H&P , NPO status , Patient's Chart, lab work & pertinent test results, reviewed documented beta blocker date and time   Airway Mallampati: II TM Distance: >3 FB Neck ROM: Full    Dental  (+) Dental Advisory Given   Pulmonary neg pulmonary ROS,  breath sounds clear to auscultation        Cardiovascular hypertension, Pt. on medications and Pt. on home beta blockers + Valvular Problems/Murmurs Rhythm:Regular Rate:Normal     Neuro/Psych  Headaches, PSYCHIATRIC DISORDERS Anxiety    GI/Hepatic Neg liver ROS, hiatal hernia, GERD-  Medicated,  Endo/Other  negative endocrine ROS  Renal/GU negative Renal ROS     Musculoskeletal negative musculoskeletal ROS (+)   Abdominal   Peds  Hematology negative hematology ROS (+)   Anesthesia Other Findings   Reproductive/Obstetrics negative OB ROS                          Anesthesia Physical Anesthesia Plan  ASA: II  Anesthesia Plan:    Post-op Pain Management:    Induction:   Airway Management Planned:   Additional Equipment:   Intra-op Plan:   Post-operative Plan:   Informed Consent: I have reviewed the patients History and Physical, chart, labs and discussed the procedure including the risks, benefits and alternatives for the proposed anesthesia with the patient or authorized representative who has indicated his/her understanding and acceptance.   Dental advisory given  Plan Discussed with: CRNA  Anesthesia Plan Comments:        Anesthesia Quick Evaluation

## 2013-09-20 NOTE — Anesthesia Postprocedure Evaluation (Signed)
Anesthesia Post Note  Patient: Allison [name]  Procedure(s) Performed: Procedure(s) (LRB): RIGHT TOTAL KNEE REPLACEMENT (Right)  Anesthesia type: Spinal  Patient location: PACU  Post pain: Pain level controlled  Post assessment: Post-op Vital signs reviewed  Last Vitals: BP 131/68  Pulse 67  Temp(Src) 36.7 C (Axillary)  Resp 16  Ht 5\' 5"  (1.651 m)  Wt 175 lb 2.4 oz (79.447 kg)  BMI 29.15 kg/m2  SpO2 96%  Post vital signs: Reviewed  Level of consciousness: sedated  Complications: No apparent anesthesia complications

## 2013-09-20 NOTE — H&P (View-Only) (Signed)
Allison [name] Allison [name] DOB: Apr 30, 1946 Widowed / Language: [name] / Race: White Female  H&P Date: 09/11/13  Chief Complaint: R knee pain  History of Present Illness The patient is a 68 year old female who comes in today for a preoperative History and Physical. The patient is scheduled for a right total knee arthroplasty to be performed by Dr. Johnn Hai, MD at [facility] on Sep 20, 2013. Pt with severe B/L knee DJD, right knee refractory to steroid injection, ice, elevation, medication, relative rest, quad strengthening, activity modifications. 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  Dr. Tonita Cong and the patient mutually agreed to proceed with a total knee replacement. Risks and benefits of the procedure were discussed including stiffness, suboptimal range of motion, persistent pain, infection requiring removal of prosthesis and reinsertion, need for prophylactic antibiotics in the future, for example, dental procedures, possible need for manipulation, revision in the future and also anesthetic complications including DVT, PE, etc. We discussed the perioperative course, time in the hospital, postoperative recovery and the need for elevation to control swelling. We also discussed the predicted range of motion and the probability that squatting and kneeling would be unobtainable in the future. In addition, postoperative anticoagulation was discussed. We have obtained preoperative medical clearance as necessary- PCP DR. McGough. Provided her illustrated handout and discussed it in detail. They will enroll in the total joint replacement educational forum at the hospital.  Past Medical Hx Oophorectomy. bilateral High blood pressure Gastroesophageal Reflux Disease Heart murmur Irritable bowel  syndrome Hypercholesterolemia Chronic Cystitis Diverticulitis Of Colon Anxiety Disorder Osteoarthritis Cataract Hemorrhoids Hiatal Hernia Urinary Tract Infection Scarlet Fever Measles Menopause  Allergies Trazodone CODEINE Codeine Phosphate *ANALGESICS - OPIOID* Sulfa Drugs  Family History Father. Living. 55 y/o, CHF, dementia Mother. Deceased. 73 y/o, COPD Siblings. Deceased. 15 y/o, depression  Social History Tobacco use. Never smoker. never smoker Tobacco / smoke exposure. no Number of flights of stairs before winded. greater than 5 Marital status. widowed Illicit drug use. no Exercise. Exercises rarely Living situation. live alone, 2 story home, has bedroom downstairs, 3 steps to enter Pain Contract. no Copy of Drug/Alcohol Rehab (Previously). no Children. 2 Drug/Alcohol Rehab (Currently). no Current work status. retired. [employer] Alcohol use. never consumed alcohol Advance Directives. none Post-Surgical Plans. SNF- [facility], then home with HHPT  Medication History Simvastatin ( Oral) Specific dose unknown - Active. Fish Oil Active. Baby Aspirin (81MG  Tablet Chewable, Oral) Active. Ziac ( Oral) Specific dose unknown - Active. Multivitamins ( Oral) Specific dose unknown - Active. PriLOSEC ( Oral) Specific dose unknown - Active. Omeprazole ( Oral) Specific dose unknown - Active. Bisoprolol-Hydrochlorothiazide (10-6.25MG  Tablet, Oral) Active. Glucosamine/Chondroitin/MSM ( Oral) Specific dose unknown - Active. Ibuprofen ( Oral) Specific dose unknown - Active. Tylenol ( Oral) Specific dose unknown - Active. Medications Reconciled.  Past Surgical History Arthroscopy of Knee. left Gallbladder Surgery. laporoscopic Hysterectomy. complete (non-cancerous) Cesarean Delivery. 2 times  Review of Systems General:Not Present- Chills, Fever, Night Sweats, Fatigue, Weight Gain, Weight Loss and Memory Loss. Skin:Not  Present- Hives, Itching, Rash, Eczema and Lesions. HEENT:Not Present- Tinnitus, Headache, Double Vision, Visual Loss, Hearing Loss and Dentures. Respiratory:Not Present- Shortness of breath with exertion, Shortness of breath at rest, Allergies, Coughing up blood and Chronic Cough. Cardiovascular:Not Present- Chest Pain, Racing/skipping heartbeats, Difficulty Breathing Lying Down,  Murmur, Swelling and Palpitations. Gastrointestinal:Not Present- Bloody Stool, Heartburn, Abdominal Pain, Vomiting, Nausea, Constipation, Diarrhea, Difficulty Swallowing, Jaundice and Loss of appetitie. Female Genitourinary:Not Present- Blood in Urine, Urinary frequency, Weak urinary stream, Discharge, Flank Pain, Incontinence, Painful Urination, Urgency, Urinary Retention and Urinating at Night. Musculoskeletal:Present- Joint Swelling, Joint Pain, Back Pain and Morning Stiffness. Not Present- Muscle Weakness, Muscle Pain and Spasms. Neurological:Not Present- Tremor, Dizziness, Blackout spells, Paralysis, Difficulty with balance and Weakness. Psychiatric:Not Present- Insomnia.  Physical Exam The physical exam findings are as follows:  General Mental Status - Alert, cooperative and good historian. General Appearance- pleasant, Anxious and appears comfortable. Not in acute distress. Orientation- Oriented X3. Build & Nutrition- Well nourished and Well developed. Gait- Use of assistive device (cane) and Antalgic.  Head and Neck Head- normocephalic, atraumatic . Neck Global Assessment- supple. no bruit auscultated on the right and no bruit auscultated on the left.  Eye Pupil- Bilateral- Regular and Round. Motion- Bilateral- EOMI.  Chest and Lung Exam Auscultation: Breath sounds:- clear at anterior chest wall and - clear at posterior chest wall. Adventitious sounds:- No Adventitious sounds.  Cardiovascular Auscultation:Rhythm- Regular rate and rhythm. Heart Sounds- S1 WNL and S2  WNL. Murmurs & Other Heart Sounds:Auscultation of the heart reveals - No Murmurs.  Abdomen Palpation/Percussion:Tenderness- Abdomen is non-tender to palpation. Rigidity (guarding)- Abdomen is soft. Auscultation:Auscultation of the abdomen reveals - Bowel sounds normal.   Female Genitourinary Not done, not pertinent to present illness  Musculoskeletal R knee tender to palpation medial and lateral jointlines. + Effusion, mild. ROM -2 to 110 degrees. + PF pain. + PF crepitus. Quad weakness 4+/5. Pulses 2+. No calf pain or sign of DVT. Varus thrust, antalgic gait.  Imaging standing knee xrays with severe end-stage bone-on-bone arthrosis, medial and PF most significant, with lateral spurring. Varus deformity.  Assessment & Plan R knee DJD  Pt with B/L knee DJD, scheduled for R TKA by Dr. Tonita Cong on 5/14. She has been cleared by her PCP, Dr. Orson Ape. She is scheduled for [facility] pre-op 5/5. We reviewed her medications, will hold NSAIDs, ASA, supplements accordingly. Will remain NPO after MN night before surgery. We again discussed total knee replacement. Discussed the procedure itself as well as risks, complications and alternatives, including but not limited to DVT, PE, infx, bleeding, failure of procedure, need for secondary procedure including manipulation, nerve injury, ongoing pain/symptoms, anesthesia risk, even stroke or death. Also discussed typical post-op protocols, activity restrictions, need for PT, flexion/extension exercises, time out of work. Discussed need for DVT ppx post-op with Xarelto then ASA per protocol. Discussed dental ppx. Also discussed limitations post-operatively such as kneeling and squatting. All questions were answered. Patient desires to proceed with surgery as scheduled.  Plan SNF initially post-op as she lives alone then home with HHPT, then outpt PT. Given her hx of PONV recommend IV tylenol and exparil to help with pain mgmt. She will follow up 10-14 days post-op  for suture removal and xrays and will call with any questions or concerns in the interim.  Plan Right total knee replacement  Signed electronically by Cecilie Kicks, PA-C for Dr. Tonita Cong

## 2013-09-20 NOTE — Progress Notes (Signed)
PT Cancellation Note  Patient Details Name: Allison [name] MRN: [id number] DOB: 01/02/1946   Cancelled Treatment:     PT POD 0 eval deferred this date 2* pt nausea.  Will follow in am.   [name] 09/20/2013, 4:00 PM

## 2013-09-20 NOTE — Progress Notes (Signed)
Clinical Social Work Department BRIEF PSYCHOSOCIAL ASSESSMENT 09/20/2013  Patient:  Allison [name],Allison [name]     Account Number:  [id number]     Admit date:  09/20/2013  Clinical Social Worker:  GERBER,HOLLY, LCSW  Date/Time:  09/20/2013 12:15 PM  Referred by:  Physician  Date Referred:  09/20/2013 Referred for  SNF Placement   Other Referral:   Interview type:  Patient Other interview type:    PSYCHOSOCIAL DATA Living Status:  ALONE Admitted from facility:   Level of care:   Primary support name:  Jonathan Primary support relationship to patient:  CHILD, ADULT Degree of support available:   Strong    CURRENT CONCERNS Current Concerns  Post-Acute Placement   Other Concerns:    SOCIAL WORK ASSESSMENT / PLAN CSW received referral in order to assist with DC planning. CSW reviewed chart and met with patient and family at bedside. CSW introduced myself and explained role. Patient agreeable to family involvement during assessment.    Patient reports she lives at home but prior to surgery she had already researched SNFs for rehab. Patient reports that she would prefer to go to [facility] and has already spoken with admission's coordinator. Patient reports that if [facility] is unable to accept then she would go to [facility]. CSW explained process along with possible copayments for insurance. Patient reports understanding and agreeable to plans.    CSW completed FL2 and faxed out to [facility] and [facility]. [facility] agree[facility] to review information and contact CSW re: if they can accept. CSW will follow up with bed offers.   Assessment/plan status:  Psychosocial Support/Ongoing Assessment of Needs Other assessment/ plan:   Information/referral to community resources:   SNF information    PATIENT'S/FAMILY'S RESPONSE TO PLAN OF CARE: Patient alert and oriented and happy that family is involved in assessment. Patient realistic about rehab and feels she will get stronger with  some additional help. Patient reports she feels calm about situation because she previously researched her options. Patient hopeful for [facility] since it is so close to her home. Patient agreeable for CSW to continue to assist.       Holly Gerber, LCSW  (Coverage for Jamie Haidinger) 

## 2013-09-20 NOTE — Transfer of Care (Signed)
Immediate Anesthesia Transfer of Care Note  Patient: Allison [name]  Procedure(s) Performed: Procedure(s): RIGHT TOTAL KNEE REPLACEMENT (Right)  Patient Location: PACU  Anesthesia Type:Spinal  Level of Consciousness: awake, alert  and oriented  Airway & Oxygen Therapy: Patient Spontanous Breathing and Patient connected to face mask oxygen  Post-op Assessment: Report given to PACU RN, Post -op Vital signs reviewed and stable and Patient moving all extremities  Post vital signs: Reviewed and stable  Complications: No apparent anesthesia complications

## 2013-09-21 ENCOUNTER — Encounter (HOSPITAL_COMMUNITY): Payer: Self-pay | Admitting: Specialist

## 2013-09-21 LAB — CBC
HEMATOCRIT: 32.4 % — AB (ref 36.0–46.0)
Hemoglobin: 10.5 g/dL — ABNORMAL LOW (ref 12.0–15.0)
MCH: 26.6 pg (ref 26.0–34.0)
MCHC: 32.4 g/dL (ref 30.0–36.0)
MCV: 82.2 fL (ref 78.0–100.0)
PLATELETS: 217 10*3/uL (ref 150–400)
RBC: 3.94 MIL/uL (ref 3.87–5.11)
RDW: 13.2 % (ref 11.5–15.5)
WBC: 12.6 10*3/uL — AB (ref 4.0–10.5)

## 2013-09-21 LAB — BASIC METABOLIC PANEL
BUN: 15 mg/dL (ref 6–23)
CO2: 24 meq/L (ref 19–32)
CREATININE: 0.81 mg/dL (ref 0.50–1.10)
Calcium: 8.4 mg/dL (ref 8.4–10.5)
Chloride: 106 mEq/L (ref 96–112)
GFR calc Af Amer: 85 mL/min — ABNORMAL LOW (ref >90)
GFR calc non Af Amer: 73 mL/min — ABNORMAL LOW (ref >90)
Glucose, Bld: 169 mg/dL — ABNORMAL HIGH (ref 70–99)
POTASSIUM: 4.1 meq/L (ref 3.7–5.3)
Sodium: 140 mEq/L (ref 137–147)

## 2013-09-21 MED ORDER — TRAMADOL HCL 50 MG PO TABS
50.0000 mg | ORAL_TABLET | Freq: Four times a day (QID) | ORAL | Status: DC | PRN
  Administered 2013-09-21 (×2): 50 mg via ORAL
  Administered 2013-09-22: 100 mg via ORAL
  Administered 2013-09-22: 50 mg via ORAL
  Administered 2013-09-22: 100 mg via ORAL
  Administered 2013-09-23 (×2): 50 mg via ORAL
  Filled 2013-09-21: qty 1
  Filled 2013-09-21: qty 2
  Filled 2013-09-21: qty 1
  Filled 2013-09-21: qty 2
  Filled 2013-09-21 (×2): qty 1
  Filled 2013-09-21: qty 2

## 2013-09-21 MED ORDER — ALUM & MAG HYDROXIDE-SIMETH 200-200-20 MG/5ML PO SUSP: 30.0000 mL | ORAL | Status: DC | PRN

## 2013-09-21 NOTE — Evaluation (Signed)
Occupational Therapy Evaluation Patient Details Name: Allison [name] MRN: [id number] DOB: 11-26-1945 Today's Date: 09/21/2013    History of Present Illness R TKR   Clinical Impression   Pt demonstrates decline in function with ADLs and ADL mobility safety with decreased strength, balance and endurance. Pt would benefit from acute OT services to address impairments to increase level of function and safety    Follow Up Recommendations  SNF;Supervision/Assistance - 24 hour    Equipment Recommendations  None recommended by OT;Other (comment) (TBD at next venue of care)    Recommendations for Other Services       Precautions / Restrictions Precautions Precautions: Fall;Knee Required Braces or Orthoses: Knee Immobilizer - Right Knee Immobilizer - Right: Discontinue once straight leg raise with < 10 degree lag Restrictions Weight Bearing Restrictions: No Other Position/Activity Restrictions: WBAT      Mobility Bed Mobility Overal bed mobility: Needs Assistance Bed Mobility: Supine to Sit     Supine to sit: Min assist;Mod assist     General bed mobility comments: pt up in recliner after PT   Transfers Overall transfer level: Needs assistance Equipment used: Rolling walker (2 wheeled) Transfers: Sit to/from Stand Sit to Stand: Mod assist         General transfer comment: cues for LE management and use of UEs to self assist    Balance Overall balance assessment: Needs assistance Sitting-balance support: No upper extremity supported;Feet supported Sitting balance-Leahy Scale: Good     Standing balance support: Bilateral upper extremity supported;Single extremity supported;During functional activity Standing balance-Leahy Scale: Fair                              ADL Overall ADL's : Needs assistance/impaired     Grooming: Wash/dry face;Wash/dry hands;Min guard;Standing;Minimal assistance   Upper Body Bathing: Supervision/ safety;Set up;Sitting    Lower Body Bathing: Maximal assistance;Sitting/lateral leans;Sit to/from stand   Upper Body Dressing : Set up;Supervision/safety;Sitting   Lower Body Dressing: Total assistance;Sitting/lateral leans;Sit to/from stand   Toilet Transfer: Moderate assistance;RW;Grab bars;BSC Toilet Transfer Details (indicate cue type and reason): cues for LE management and use of UEs to self assist Toileting- Clothing Manipulation and Hygiene: Moderate assistance;Sit to/from stand       Functional mobility during ADLs: Moderate assistance General ADL Comments: Pt/family provided wiht education and demo of ADL A/E for home use. Pt/family povided with education on use of tub bench with picture proivded     Vision  wears glasses at all times                              Pertinent Vitals/Pain 5/10 R LE, VSS     Hand Dominance Right   Extremity/Trunk Assessment Upper Extremity Assessment Upper Extremity Assessment: Generalized weakness   Lower Extremity Assessment Lower Extremity Assessment: Defer to PT evaluation RLE Deficits / Details: 2/5 quads with AAROM at knee -10 - 40   Cervical / Trunk Assessment Cervical / Trunk Assessment: Normal   Communication Communication Communication: No difficulties   Cognition Arousal/Alertness: Awake/alert Behavior During Therapy: WFL for tasks assessed/performed Overall Cognitive Status: Within Functional Limits for tasks assessed                     General Comments   Pt pleasant and cooperative                Home Living Family/patient  expects to be discharged to:: Skilled nursing facility Living Arrangements: Alone Available Help at Discharge: Family Type of Home: House Home Access: Stairs to enter [employer] of Steps: 3 Entrance Stairs-Rails: None Home Layout: Two level;Able to live on main level with bedroom/bathroom   Alternate Level Stairs-Rails: None Bathroom Shower/Tub: Walk-in shower;Tub/shower  unit   Bathroom Toilet: Standard     Home Equipment: Cane - single point          Prior Functioning/Environment Level of Independence: Independent with assistive device(s)        Comments: Used cane    OT Diagnosis: Generalized weakness;Acute pain   OT Problem List: Decreased strength;Decreased knowledge of use of DME or AE;Decreased activity tolerance;Impaired balance (sitting and/or standing);Pain   OT Treatment/Interventions: Self-care/ADL training;Therapeutic exercise;Patient/family education;Neuromuscular education;Balance training;Therapeutic activities;DME and/or AE instruction    OT Goals(Current goals can be found in the care plan section) Acute Rehab OT Goals Patient Stated Goal: Rehab and resume previous lifestyle with decreased pain ADL Goals Pt Will Perform Grooming: with min guard assist;with supervision;with set-up;standing Pt Will Perform Lower Body Bathing: with mod assist;sitting/lateral leans;sit to/from stand Pt Will Perform Lower Body Dressing: with max assist;with mod assist;sitting/lateral leans;sit to/from stand Pt Will Transfer to Toilet: with min assist;ambulating;regular height toilet;grab bars (3 in 1 over toilet) Pt Will Perform Toileting - Clothing Manipulation and hygiene: with min assist;sitting/lateral leans;sit to/from stand  OT Frequency: Min 2X/week   Barriers to D/C: Decreased caregiver support  pt lives at home alone                     End of Session Equipment Utilized During Treatment: Gait belt;Rolling walker;Other (comment) (3 in 1, ADL A/E) CPM Right Knee CPM Right Knee: Off  Activity Tolerance:   Patient left: in chair;with call bell/phone within reach;with family/visitor present   Time: 6295-2841 OT Time Calculation (min): 22 min Charges:  OT General Charges $OT Visit: 1 Procedure OT Evaluation $Initial OT Evaluation Tier I: 1 Procedure OT Treatments $Therapeutic Activity: 8-22 mins G-Codes:    Mosetta Putt Sep 26, 2013, 2:39 PM

## 2013-09-21 NOTE — Evaluation (Signed)
Physical Therapy Evaluation Patient Details Name: Allison [name] MRN: [id number] DOB: 1945-12-11 Today's Date: 09/21/2013   History of Present Illness  R TKR  Clinical Impression  Pt s/p R TKR presents with decreased R LE strength/ROM and post op pain limiting functional mobility.  Pt will benefit from follow up rehab at SNF level to maximize IND and safety prior to return home with limited assist.    Follow Up Recommendations SNF    Equipment Recommendations  Rolling walker with 5" wheels    Recommendations for Other Services OT consult     Precautions / Restrictions Precautions Precautions: Fall;Knee Required Braces or Orthoses: Knee Immobilizer - Right Knee Immobilizer - Right: Discontinue once straight leg raise with < 10 degree lag Restrictions Weight Bearing Restrictions: No Other Position/Activity Restrictions: WBAT      Mobility  Bed Mobility Overal bed mobility: Needs Assistance Bed Mobility: Supine to Sit     Supine to sit: Min assist;Mod assist     General bed mobility comments: cues for sequence and use of L LE to self assist  Transfers Overall transfer level: Needs assistance Equipment used: Rolling walker (2 wheeled) Transfers: Sit to/from Stand Sit to Stand: Mod assist         General transfer comment: cues for LE management and use of UEs to self assist  Ambulation/Gait Ambulation/Gait assistance: Min assist;Mod assist Ambulation Distance (Feet): 38 Feet Assistive device: Rolling walker (2 wheeled) Gait Pattern/deviations: Step-to pattern;Decreased step length - right;Decreased step length - left;Shuffle;Trunk flexed     General Gait Details: cues for sequence, posture and position from [employer]            Wheelchair Mobility    Modified Rankin (Stroke Patients Only)       Balance                                             Pertinent Vitals/Pain 4/10; premed; cold packs provided    Home Living  Family/patient expects to be discharged to:: Skilled nursing facility Living Arrangements: Alone Available Help at Discharge: Family Type of Home: House Home Access: Stairs to enter Entrance Stairs-Rails: None Entrance Stairs-Number of Steps: 3 Home Layout: Two level;Able to live on main level with bedroom/bathroom Home Equipment: Kasandra Knudsen - single point      Prior Function Level of Independence: Independent with assistive device(s)         Comments: Used cane     Hand Dominance   Dominant Hand: Right    Extremity/Trunk Assessment   Upper Extremity Assessment: Overall WFL for tasks assessed           Lower Extremity Assessment: RLE deficits/detail RLE Deficits / Details: 2/5 quads with AAROM at knee -10 - 40       Communication   Communication: No difficulties  Cognition Arousal/Alertness: Awake/alert Behavior During Therapy: WFL for tasks assessed/performed Overall Cognitive Status: Within Functional Limits for tasks assessed                      General Comments      Exercises Total Joint Exercises Ankle Circles/Pumps: AROM;Both;15 reps;Supine Quad Sets: AROM;Both;10 reps;Supine Heel Slides: AAROM;Right;15 reps;Supine Straight Leg Raises: AAROM;Right;10 reps;Supine      Assessment/Plan    PT Assessment Patient needs continued PT services  PT Diagnosis Difficulty walking   PT Problem List Decreased  strength;Decreased range of motion;Decreased activity tolerance;Decreased mobility;Decreased knowledge of use of DME;Pain  PT Treatment Interventions DME instruction;Gait training;Functional mobility training;Therapeutic activities;Therapeutic exercise;Patient/family education   PT Goals (Current goals can be found in the Care Plan section) Acute Rehab PT Goals Patient Stated Goal: Rehab and resume previous lifestyle with decreased pain PT Goal Formulation: With patient Potential to Achieve Goals: Good    Frequency 7X/week   Barriers to discharge         Co-evaluation               End of Session Equipment Utilized During Treatment: Gait belt;Right knee immobilizer Activity Tolerance: Patient tolerated treatment well Patient left: in chair;with call bell/phone within reach;with family/visitor present Nurse Communication: Mobility status         Time: 7416-3845 PT Time Calculation (min): 35 min   Charges:   PT Evaluation $Initial PT Evaluation Tier I: 1 Procedure PT Treatments $Gait Training: 8-22 mins $Therapeutic Exercise: 8-22 mins   PT G Codes:          [name] 09/21/2013, 12:30 PM

## 2013-09-21 NOTE — Progress Notes (Signed)
Patient ID: Allison [name], female   DOB: December 12, 1945, 68 y.o.   MRN: [id number]  Subjective: 1 Day Post-Op Procedure(s) (LRB): RIGHT TOTAL KNEE REPLACEMENT (Right) Patient reports pain as moderate.  Toradol did help with pain yesterday afternoon but has now been stopped since she is on Xarelto. She did experience N/V with both Dilaudid and Morphine yesterday. That has improved today.  Patient has complaints of R knee pain  We will start therapy today. Plan is to go to SNF after hospital stay. Seen by myself and Dr. Tonita Cong this AM in rounds.  Objective: Vital signs in last 24 hours: Temp:  [96.2 F (35.7 C)-98.8 F (37.1 C)] 98.2 F (36.8 C) (05/15 0555) Pulse Rate:  [59-86] 67 (05/15 0555) Resp:  [10-18] 16 (05/15 0555) BP: (111-165)/(64-94) 128/73 mmHg (05/15 0555) SpO2:  [96 %-100 %] 98 % (05/15 0555) Weight:  [79.447 kg (175 lb 2.4 oz)] 79.447 kg (175 lb 2.4 oz) (05/14 1137)  Intake/Output from previous day:  Intake/Output Summary (Last 24 hours) at 09/21/13 0738 Last data filed at 09/21/13 0630  Gross per 24 hour  Intake 3753.67 ml  Output   3105 ml  Net 648.67 ml    Intake/Output this shift:    Labs: Results for orders placed during the hospital encounter of 09/20/13  CBC      Result Value Ref Range   WBC 12.6 (*) 4.0 - 10.5 K/uL   RBC 3.94  3.87 - 5.11 MIL/uL   Hemoglobin 10.5 (*) 12.0 - 15.0 g/dL   HCT 32.4 (*) 36.0 - 46.0 %   MCV 82.2  78.0 - 100.0 fL   MCH 26.6  26.0 - 34.0 pg   MCHC 32.4  30.0 - 36.0 g/dL   RDW 13.2  11.5 - 15.5 %   Platelets 217  150 - 400 K/uL  BASIC METABOLIC PANEL      Result Value Ref Range   Sodium 140  137 - 147 mEq/L   Potassium 4.1  3.7 - 5.3 mEq/L   Chloride 106  96 - 112 mEq/L   CO2 24  19 - 32 mEq/L   Glucose, Bld 169 (*) 70 - 99 mg/dL   BUN 15  6 - 23 mg/dL   Creatinine, Ser 0.81  0.50 - 1.10 mg/dL   Calcium 8.4  8.4 - 10.5 mg/dL   GFR calc non Af Amer 73 (*) >90 mL/min   GFR calc Af Amer 85 (*) >90 mL/min    Exam -  Neurologically intact ABD soft Neurovascular intact Sensation intact distally Intact pulses distally Dorsiflexion/Plantar flexion intact Incision: dressing C/D/I and no drainage No cellulitis present Compartment soft no calf pain or sign of DVT Dressing - clean, dry, no drainage Motor function intact - moving foot and toes well on exam.  Hemovac pulled without difficulty.  Assessment/Plan: 1 Day Post-Op Procedure(s) (LRB): RIGHT TOTAL KNEE REPLACEMENT (Right)  Advance diet Up with therapy D/C IV fluids Past Medical History  Diagnosis Date  . Hypertension   . Irritable bowel syndrome   . Gastroesophageal reflux   . Anxiety   . Heart murmur   . Hypercholesteremia   . Hemorrhoid     hx. of  . H/O hiatal hernia   . Diverticulosis   . UTI symptoms     6 weeks ago-tx. Cipro-asymptomatic now  . Arthritis     degenerative joint disease    DVT Prophylaxis - Xarelto Protocol Weight-Bearing as tolerated to R leg Keep foley until tomorrow.  No vaccines. Plan D/C to SNF when ready- likely Sunday  Jaclyn M. Bissell 09/21/2013, 7:38 AM

## 2013-09-21 NOTE — Progress Notes (Signed)
CSW assisting with d/c planning. [facility] has been faxed tentative d/c summary for possible d/c on Sunday. Weekend CSW will assist with d/c planning . Pt is aware and in agreement with d/c plan.  Werner Lean LCSW [phone]

## 2013-09-21 NOTE — Discharge Summary (Signed)
Physician Discharge Summary   Patient ID: Allison [name] MRN: [id number] DOB/AGE: [birth year] 68 y.o.  Admit date: 09/20/2013 Discharge date: anticipated 09/23/13  Primary Diagnosis: R knee DJD  Admission Diagnoses:  Past Medical History  Diagnosis Date  . Hypertension   . Irritable bowel syndrome   . Gastroesophageal reflux   . Anxiety   . Heart murmur   . Hypercholesteremia   . Hemorrhoid     hx. of  . H/O hiatal hernia   . Diverticulosis   . UTI symptoms     6 weeks ago-tx. Cipro-asymptomatic now  . Arthritis     degenerative joint disease   Discharge Diagnoses:   Principal Problem:   Right knee DJD  Estimated body mass index is 29.15 kg/(m^2) as calculated from the following:   Height as of this encounter: 5' 5"  (1.651 m).   Weight as of this encounter: 79.447 kg (175 lb 2.4 oz).  Procedure:  Procedure(s) (LRB): RIGHT TOTAL KNEE REPLACEMENT (Right)   Consults: None  HPI: see H&P Laboratory Data: Admission on 09/20/2013  Component Date Value Ref Range Status  . WBC 09/21/2013 12.6* 4.0 - 10.5 K/uL Final  . RBC 09/21/2013 3.94  3.87 - 5.11 MIL/uL Final  . Hemoglobin 09/21/2013 10.5* 12.0 - 15.0 g/dL Final  . HCT 09/21/2013 32.4* 36.0 - 46.0 % Final  . MCV 09/21/2013 82.2  78.0 - 100.0 fL Final  . MCH 09/21/2013 26.6  26.0 - 34.0 pg Final  . MCHC 09/21/2013 32.4  30.0 - 36.0 g/dL Final  . RDW 09/21/2013 13.2  11.5 - 15.5 % Final  . Platelets 09/21/2013 217  150 - 400 K/uL Final  . Sodium 09/21/2013 140  137 - 147 mEq/L Final  . Potassium 09/21/2013 4.1  3.7 - 5.3 mEq/L Final  . Chloride 09/21/2013 106  96 - 112 mEq/L Final  . CO2 09/21/2013 24  19 - 32 mEq/L Final  . Glucose, Bld 09/21/2013 169* 70 - 99 mg/dL Final  . BUN 09/21/2013 15  6 - 23 mg/dL Final  . Creatinine, Ser 09/21/2013 0.81  0.50 - 1.10 mg/dL Final  . Calcium 09/21/2013 8.4  8.4 - 10.5 mg/dL Final  . GFR calc non Af Amer 09/21/2013 73* >90 mL/min Final  . GFR calc Af Amer 09/21/2013 85*  >90 mL/min Final   Comment: (NOTE)                          The eGFR has been calculated using the CKD EPI equation.                          This calculation has not been validated in all clinical situations.                          eGFR's persistently <90 mL/min signify possible Chronic Kidney                          Disease.  Hospital Outpatient Visit on 09/11/2013  Component Date Value Ref Range Status  . MRSA, PCR 09/11/2013 NEGATIVE  NEGATIVE Final  . Staphylococcus aureus 09/11/2013 POSITIVE* NEGATIVE Final   Comment:                                 The Xpert SA  Assay (FDA                          approved for NASAL specimens                          in patients over 73 years of age),                          is one component of                          a comprehensive surveillance                          program.  Test performance has                          been validated by [employer] for patients greater                          than or equal to 24 year old.                          It is not intended                          to diagnose infection nor to                          guide or monitor treatment.  . Sodium 09/11/2013 140  137 - 147 mEq/L Final  . Potassium 09/11/2013 4.8  3.7 - 5.3 mEq/L Final  . Chloride 09/11/2013 101  96 - 112 mEq/L Final  . CO2 09/11/2013 26  19 - 32 mEq/L Final  . Glucose, Bld 09/11/2013 126* 70 - 99 mg/dL Final  . BUN 09/11/2013 18  6 - 23 mg/dL Final  . Creatinine, Ser 09/11/2013 0.64  0.50 - 1.10 mg/dL Final  . Calcium 09/11/2013 9.7  8.4 - 10.5 mg/dL Final  . GFR calc non Af Amer 09/11/2013 >90  >90 mL/min Final  . GFR calc Af Amer 09/11/2013 >90  >90 mL/min Final   Comment: (NOTE)                          The eGFR has been calculated using the CKD EPI equation.                          This calculation has not been validated in all clinical situations.                          eGFR's persistently <90  mL/min signify possible Chronic Kidney                          Disease.  . WBC 09/11/2013 7.7  4.0 - 10.5 K/uL Final  . RBC 09/11/2013 4.94  3.87 - 5.11 MIL/uL Final  .  Hemoglobin 09/11/2013 13.1  12.0 - 15.0 g/dL Final  . HCT 09/11/2013 40.7  36.0 - 46.0 % Final  . MCV 09/11/2013 82.4  78.0 - 100.0 fL Final  . MCH 09/11/2013 26.5  26.0 - 34.0 pg Final  . MCHC 09/11/2013 32.2  30.0 - 36.0 g/dL Final  . RDW 09/11/2013 13.4  11.5 - 15.5 % Final  . Platelets 09/11/2013 261  150 - 400 K/uL Final  . Prothrombin Time 09/11/2013 12.1  11.6 - 15.2 seconds Final  . INR 09/11/2013 0.91  0.00 - 1.49 Final  . ABO/RH(D) 09/11/2013 O POS   Final  . Antibody Screen 09/11/2013 NEG   Final  . Sample Expiration 09/11/2013 09/23/2013   Final  . Color, Urine 09/11/2013 YELLOW  YELLOW Final  . APPearance 09/11/2013 CLEAR  CLEAR Final  . Specific Gravity, Urine 09/11/2013 1.021  1.005 - 1.030 Final  . pH 09/11/2013 5.5  5.0 - 8.0 Final  . Glucose, UA 09/11/2013 NEGATIVE  NEGATIVE mg/dL Final  . Hgb urine dipstick 09/11/2013 NEGATIVE  NEGATIVE Final  . Bilirubin Urine 09/11/2013 NEGATIVE  NEGATIVE Final  . Ketones, ur 09/11/2013 NEGATIVE  NEGATIVE mg/dL Final  . Protein, ur 09/11/2013 NEGATIVE  NEGATIVE mg/dL Final  . Urobilinogen, UA 09/11/2013 0.2  0.0 - 1.0 mg/dL Final  . Nitrite 09/11/2013 NEGATIVE  NEGATIVE Final  . Leukocytes, UA 09/11/2013 NEGATIVE  NEGATIVE Final   MICROSCOPIC NOT DONE ON URINES WITH NEGATIVE PROTEIN, BLOOD, LEUKOCYTES, NITRITE, OR GLUCOSE <1000 mg/dL.  . ABO/RH(D) 09/11/2013 O POS   Final     X-Rays:Dg Chest 2 View  09/12/2013   CLINICAL DATA:  Preop right knee replacement  EXAM: CHEST  2 VIEW  COMPARISON:  DG CHEST 2V dated 09/10/2011  FINDINGS: The heart size and mediastinal contours are within normal limits. Both lungs are clear. The visualized skeletal structures are unremarkable.  IMPRESSION: No active cardiopulmonary disease.   Electronically Signed   By: Kathreen Devoid   On:  09/12/2013 01:28   Dg Knee 1-2 Views Right  09/11/2013   CLINICAL DATA:  Preop for right knee total knee arthroplasty.  EXAM: RIGHT KNEE - 1-2 VIEW  COMPARISON:  None.  FINDINGS: The knee is located. There is marked joint space loss of the medial compartment. There is osteophyte formation of the lateral compartment, with fairly preserved joint space.  There is joint space loss and large osteophyte formation in the patellofemoral compartment. There is a small joint effusion. No fracture or suspicious bony lesion.  IMPRESSION: Advanced osteoarthritis of the right knee with small joint effusion.   Electronically Signed   By: Curlene Dolphin M.D.   On: 09/11/2013 15:23   Dg Knee Right Port  09/20/2013   CLINICAL DATA:  Status post total knee replacement  EXAM: PORTABLE RIGHT KNEE - 1-2 VIEW  COMPARISON:  DG KNEE1- 2 VIEWS*R* dated 09/11/2013  FINDINGS: Tricompartmental knee replacement identified with anticipated postoperative change over the lateral joint line. There is a postoperative drainage catheter in the knee joint. Components of the arthroplasty are in anticipated position.  IMPRESSION: Anticipated postoperative appearance   Electronically Signed   By: Skipper Cliche M.D.   On: 09/20/2013 11:10    EKG: Orders placed during the hospital encounter of 09/20/13  . EKG 12-LEAD  . EKG 12-LEAD     Hospital Course: Allison [name] is a 68 y.o. who was admitted to [facility]. They were brought to the operating room on 09/20/2013 and underwent Procedure(s):  RIGHT TOTAL KNEE REPLACEMENT.  Patient tolerated the procedure well and was later transferred to the recovery room and then to the orthopaedic floor for postoperative care.  They were given PO and IV analgesics for pain control following their surgery.  They were given 24 hours of postoperative antibiotics of  Anti-infectives   Start     Dose/Rate Route Frequency Ordered Stop   09/20/13 1400  ceFAZolin (ANCEF) IVPB 2 g/50 mL premix     2  g 100 mL/hr over 30 Minutes Intravenous Every 6 hours 09/20/13 1142 09/21/13 0217   09/20/13 0828  polymyxin B 500,000 Units, bacitracin 50,000 Units in sodium chloride irrigation 0.9 % 500 mL irrigation  Status:  Discontinued       As needed 09/20/13 0828 09/20/13 1010   09/20/13 0600  ceFAZolin (ANCEF) IVPB 2 g/50 mL premix     2 g 100 mL/hr over 30 Minutes Intravenous On call to O.R. 09/20/13 0554 09/20/13 0745   09/20/13 0554  clindamycin (CLEOCIN) IVPB 900 mg     900 mg 100 mL/hr over 30 Minutes Intravenous On call to O.R. 09/20/13 0554 09/20/13 0730     and started on DVT prophylaxis in the form of Xarelto, TED hose and SCDs.   PT and OT were ordered for total joint protocol.  Discharge planning consulted to help with postop disposition and equipment needs.  Patient had a difficult night on the evening of surgery due to N/V which improved after holding dilaudid and morphine and adding toradol.  They started to get up OOB with therapy on day one. Hemovac drain was pulled without difficulty.  Continued to work with therapy into day two. By day three, the patient had progressed with therapy and meeting their goals.  Incision was healing well.  Patient was seen in rounds and was ready to go home.   Diet: Regular diet Activity:WBAT Follow-up:in 10-14 days Disposition - Skilled nursing facility Discharged Condition: good      Medication List    TAKE these medications       docusate sodium 100 MG capsule  Commonly known as:  COLACE  Take 1 capsule (100 mg total) by mouth 2 (two) times daily as needed for mild constipation.     ondansetron 8 MG tablet  Commonly known as:  ZOFRAN  Take 1 tablet (8 mg total) by mouth every 8 (eight) hours as needed for nausea or vomiting.     rivaroxaban 10 MG Tabs tablet  Commonly known as:  XARELTO  Take 1 tablet (10 mg total) by mouth daily.     traMADol 50 MG tablet  Commonly known as:  ULTRAM  Take 1 tablet (50 mg total) by mouth every 6  (six) hours as needed.      ASK your doctor about these medications       acetaminophen 500 MG tablet  Commonly known as:  TYLENOL  Take 1,000 mg by mouth every 6 (six) hours as needed for mild pain.     bisoprolol-hydrochlorothiazide 10-6.25 MG per tablet  Commonly known as:  ZIAC  Take 1 tablet by mouth every morning.     Co Q 10 100 MG Caps  Take 1 capsule by mouth 2 (two) times daily.     Fish Oil 1200 MG Caps  Take 1,200 mg by mouth 2 (two) times daily.     ibuprofen 800 MG tablet  Commonly known as:  ADVIL,MOTRIN  Take 400 mg by mouth every 8 (eight) hours as  needed. pain     multivitamin with minerals Tabs tablet  Take 1 tablet by mouth daily.     omeprazole 20 MG capsule  Commonly known as:  PRILOSEC  Take 20 mg by mouth daily.     simvastatin 20 MG tablet  Commonly known as:  ZOCOR  Take 20 mg by mouth at bedtime.           Follow-up Information   Follow up with BEANE,JEFFREY C, MD In 14 days.   Specialty:  Orthopedic Surgery   Contact information:   [address] [place] [address] [phone]       Signed: Lacie Draft, PA-C Orthopaedic Surgery 09/21/2013, 7:41 AM

## 2013-09-21 NOTE — Progress Notes (Signed)
Physical Therapy Treatment Patient Details Name: Allison [name] MRN: [id number] DOB: 1945-06-08 Today's Date: 09/21/2013    History of Present Illness R TKR    PT Comments      Follow Up Recommendations  SNF     Equipment Recommendations  Rolling walker with 5" wheels    Recommendations for Other Services OT consult     Precautions / Restrictions Precautions Precautions: Fall;Knee Required Braces or Orthoses: Knee Immobilizer - Right Knee Immobilizer - Right: Discontinue once straight leg raise with < 10 degree lag Restrictions Weight Bearing Restrictions: No Other Position/Activity Restrictions: WBAT    Mobility  Bed Mobility Overal bed mobility: Needs Assistance Bed Mobility: Sit to Supine     Supine to sit: Min assist;Mod assist Sit to supine: Min assist   General bed mobility comments: cues for sequence and use of L LE to self assist  Transfers Overall transfer level: Needs assistance Equipment used: Rolling walker (2 wheeled) Transfers: Sit to/from Stand Sit to Stand: Min assist         General transfer comment: cues for LE management and use of UEs to self assist  Ambulation/Gait Ambulation/Gait assistance: Min assist Ambulation Distance (Feet): 75 Feet Assistive device: Rolling walker (2 wheeled) Gait Pattern/deviations: Step-to pattern;Decreased step length - right;Decreased step length - left;Trunk flexed Gait velocity: decr   General Gait Details: cues for sequence, posture and position from [employer]    Modified Rankin (Stroke Patients Only)       Balance Overall balance assessment: Needs assistance Sitting-balance support: No upper extremity supported;Feet supported Sitting balance-Leahy Scale: Good     Standing balance support: Bilateral upper extremity supported;Single extremity supported;During functional activity Standing balance-Leahy Scale: Fair                       Cognition Arousal/Alertness: Awake/alert Behavior During Therapy: WFL for tasks assessed/performed Overall Cognitive Status: Within Functional Limits for tasks assessed                      Exercises Total Joint Exercises Ankle Circles/Pumps: AROM;Both;15 reps;Supine Quad Sets: AROM;Both;10 reps;Supine Heel Slides: AAROM;Right;15 reps;Supine Straight Leg Raises: AAROM;Right;10 reps;Supine    General Comments        Pertinent Vitals/Pain 5/10; premed, RN aware    Home Living Family/patient expects to be discharged to:: Skilled nursing facility Living Arrangements: Alone Available Help at Discharge: Family Type of Home: House Home Access: Stairs to enter Entrance Stairs-Rails: None Home Layout: Two level;Able to live on main level with bedroom/bathroom Home Equipment: Kasandra Knudsen - single point      Prior Function Level of Independence: Independent with assistive device(s)      Comments: Used cane   PT Goals (current goals can now be found in the care plan section) Acute Rehab PT Goals Patient Stated Goal: Rehab and resume previous lifestyle with decreased pain PT Goal Formulation: With patient Potential to Achieve Goals: Good Progress towards PT goals: Progressing toward goals    Frequency  7X/week    PT Plan Current plan remains appropriate    Co-evaluation             End of Session Equipment Utilized During Treatment: Gait belt;Right knee immobilizer Activity Tolerance: Patient tolerated treatment well Patient left: in bed;with call bell/phone within reach     Time: 1400-1430 PT Time Calculation (min): 30 min  Charges:  $Gait  Training: 23-37 mins $Therapeutic Exercise: 8-22 mins                    G Codes:      [name] Oct 03, 2013, 3:18 PM

## 2013-09-21 NOTE — Op Note (Signed)
Allison [name], [name]              ACCOUNT NO.:  [id number]  MEDICAL RECORD NO.:  [id number]  LOCATION:  8756                         FACILITY:  [facility]  PHYSICIAN:  Susa Day, M.D.    DATE OF BIRTH:  05-03-1946  DATE OF PROCEDURE:  09/20/2013 DATE OF DISCHARGE:                              OPERATIVE REPORT   PREOPERATIVE DIAGNOSIS:  Degenerative joint disease, right knee, varus deformity.  POSTOPERATIVE DIAGNOSIS:  Degenerative joint disease, right knee, varus deformity.  PROCEDURE PERFORMED:  Right total knee arthroplasty.  COMPONENTS UTILIZED:  DePuy rotating platform 2.5 femur, 2.5 tibia, 12.5 mm insert, 32 patella.  ANESTHESIA:  Spinal.  ASSISTANT:  Cleophas Dunker, PA.  HISTORY:  A 68 year old with end-stage bone-on-bone arthrosis, medial compartment, right knee; refractory to conservative treatment, indicated for replacement of the degenerated joint.  Risks and benefits discussed including bleeding, infection, damage to neurovascular structures, DVT, PE, anesthetic complications, etc.  TECHNIQUE:  With the patient in supine position, after the induction of adequate spinal anesthesia, the right lower extremity was prepped, draped, and exsanguinated in the usual sterile fashion.  Thigh tourniquet inflated to 300 mmHg.  Midline incision was then made over the patella.  Knee was flexed.  Median patellar arthrotomy performed. Patella everted and knee flexed.  We elevated soft tissues medially, preserving the MCL attachment.  Tricompartmental osteoarthrosis bone-on- bone was noted.  A rongeur was utilized to remove osteophytes.  Medial and lateral menisci were removed as was the ACL remnants.  Step drill was utilized to enter the femoral canal, it was irrigated.  T-handle reamer placed, 5 degree right was utilized with 10 off the distal femur. This was pinned and cut.  Then, we sized off the distal femur, the anterior cortex to a 2.5 femur, this was pinned in 3  degrees of external rotation.  We performed anterior, posterior, and chamfer cuts. Attention was turned towards the tibia and it was somewhat unique in that the wear was anteromedially.  There was some rotation externally of the tibia noted.  We sized this 10 off the high side, which was posteromedially, we measured it off the lateral side, which was 10 as well.  This was pinned with external alignment guide, parallel to the tibial shaft, slight posterior slope in line with the first and second rays.  This was pinned and cut performed.  We then tried flexion/extension gaps with a block and they were equivalent.  I turned attention then towards completing the tibia with the tibia flexed and subluxed.  The tibia was sized to a 2.5 with maximal coverage just to the medial aspect of the tibial tubercle pin, drilled centrally, punch pin guide was placed.  Turned our attention towards completing the femur, a box cut jig was then applied, flushed to the lateral surface bisecting the notch.  The box cut was then performed and then rasped and trial tibia was then placed with a 10 and then a 12.5 mm insert with full extension, full flexion, and good stability, varus valgus stressing at 0 and 30 degrees with excellent patellofemoral tracking, the 12.5 was better.  Turned our attention towards the patella, it was measured to a 22 to 23, we planed  it to 15, there was a small 32.  We drilled the PEG holes medializing the patella.  I then placed a patellar button and the patellofemoral tracking was excellent.  Then, all instrumentation was removed.  Used pulsatile lavage to clean the bony surfaces.  We checked posteriorly cauterized geniculates.  The popliteus was intact.  We removed osteophytes and loose bodies.  Knee was flexed, all surfaces thoroughly dried.  We used Mikhail retractors to sublux the tibia. Cement was mixed on the back table in the appropriate fashion, was injected under pressure into  the tibial canal, and we pressurized it. We then impacted the 2.5 tray, cemented the femur, redundant cement removed, placed 12.5 insert, reduced the knee and held it in an axial load throughout the curing of the cement in slight flexion.  Cemented the patellar button as well.  After curing the cement, we flexed the knee, removed redundant cement, trialed a 12.5, it was excellent.  I then removed the trial, placed the permanent 12.5 insert, again after removing redundant cement copiously irrigating the wound with antibiotic irrigation, placed a 12.5 permanent rotating platform insert, reduced, full extension, full flexion, good stability, varus valgus stressing at 0 and 30 degrees.  Excellent patellofemoral tracking and a negative anterior drawer.  Copiously irrigated the wound.  Placed a Hemovac and brought it out through a lateral stab wound in the skin.  Placed Exparel into the periosteum of the femur.  Repaired the patellar arthrotomy with the knee in slight flexion, reapproximated it with 1-Vicryl first and then a running V-Loc.  Following this, subcu with 2-0 and skin with subcuticular suture.  Wound dressed sterilely.  Prior to the final closure, Exparel was placed in the knee musculature.  We had flexion to gravity 90 degrees.  Following closure, we had excellent stability and excellent patellofemoral tracking.  Tourniquet was deflated and there was adequate revascularization of the lower extremity appreciated.  The patient tolerated the procedure well.  No complications.  Assistant, Cleophas Dunker, PA, was utilized throughout the case for exposure, holding of the extremity, patient positioning, closure, etc.     Susa Day, M.D.     Allison [name]  D:  09/20/2013  T:  09/21/2013  Job:  [id number]

## 2013-09-21 NOTE — Discharge Instructions (Signed)
Elevate leg above heart 6x a day for 74minutes each Use knee immobilizer while walking until can SLR x 10 Use knee immobilizer in bed to keep knee in extension Daily dressing changes Aquacel dressing may remain in place for 7 days. May shower with aquacel dressing in place. After 7 days, remove aquacel dressing. Do not remove steri-strips if they are present. Place new dressing with gauze and tape or ACE bandage which should be kept clean and dry and changed daily.  Information on my medicine - XARELTO (Rivaroxaban)  This medication education was reviewed with me or my healthcare representative as part of my discharge preparation.  The pharmacist that spoke with me during my hospital stay was:  Julio Sicks, [facility]  Why was Xarelto prescribed for you? Xarelto was prescribed for you to reduce the risk of blood clots forming after orthopedic surgery. The medical term for these abnormal blood clots is venous thromboembolism (VTE).  What do you need to know about xarelto ? Take your Xarelto ONCE DAILY at the same time every day. You may take it either with or without food.  If you have difficulty swallowing the tablet whole, you may crush it and mix in applesauce just prior to taking your dose.  Take Xarelto exactly as prescribed by your doctor and DO NOT stop taking Xarelto without talking to the doctor who prescribed the medication.  Stopping without other VTE prevention medication to take the place of Xarelto may increase your risk of developing a clot.  After discharge, you should have regular check-up appointments with your healthcare provider that is prescribing your Xarelto.    What do you do if you miss a dose? If you miss a dose, take it as soon as you remember on the same day then continue your regularly scheduled once daily regimen the next day. Do not take two doses of Xarelto on the same day.   Important Safety Information A possible side effect of Xarelto is bleeding.  You should call your healthcare provider right away if you experience any of the following:   Bleeding from an injury or your nose that does not stop.   Unusual colored urine (red or dark brown) or unusual colored stools (red or black).   Unusual bruising for unknown reasons.   A serious fall or if you hit your head (even if there is no bleeding).  Some medicines may interact with Xarelto and might increase your risk of bleeding while on Xarelto. To help avoid this, consult your healthcare provider or pharmacist prior to using any new prescription or non-prescription medications, including herbals, vitamins, non-steroidal anti-inflammatory drugs (NSAIDs) and supplements.  This website has more information on Xarelto: [email or web address]

## 2013-09-22 LAB — CBC
HEMATOCRIT: 32.6 % — AB (ref 36.0–46.0)
HEMOGLOBIN: 10.5 g/dL — AB (ref 12.0–15.0)
MCH: 26.4 pg (ref 26.0–34.0)
MCHC: 32.2 g/dL (ref 30.0–36.0)
MCV: 82.1 fL (ref 78.0–100.0)
Platelets: 208 10*3/uL (ref 150–400)
RBC: 3.97 MIL/uL (ref 3.87–5.11)
RDW: 13.6 % (ref 11.5–15.5)
WBC: 14.3 10*3/uL — ABNORMAL HIGH (ref 4.0–10.5)

## 2013-09-22 MED ORDER — PANTOPRAZOLE SODIUM 40 MG PO TBEC
40.0000 mg | DELAYED_RELEASE_TABLET | Freq: Every day | ORAL | Status: DC
  Administered 2013-09-22 – 2013-09-23 (×2): 40 mg via ORAL
  Filled 2013-09-22 (×2): qty 1

## 2013-09-22 NOTE — Progress Notes (Signed)
Physical Therapy Treatment Patient Details Name: Allison [name] MRN: [id number] DOB: 26-Apr-1946 Today's Date: 2013-10-04    History of Present Illness R TKR    PT Comments    Pt continues with increased anxiety but also very motivated to progress  Follow Up Recommendations  SNF     Equipment Recommendations  Rolling walker with 5" wheels    Recommendations for Other Services OT consult     Precautions / Restrictions Precautions Precautions: Fall;Knee Required Braces or Orthoses: Knee Immobilizer - Right Knee Immobilizer - Right: Discontinue once straight leg raise with < 10 degree lag Restrictions Weight Bearing Restrictions: No Other Position/Activity Restrictions: WBAT    Mobility  Bed Mobility Overal bed mobility: Needs Assistance Bed Mobility: Sit to Supine       Sit to supine: Min assist   General bed mobility comments: cues for sequence and use of L LE to self assist  Transfers Overall transfer level: Needs assistance Equipment used: Rolling walker (2 wheeled) Transfers: Sit to/from Stand Sit to Stand: Min assist         General transfer comment: cues for LE management and use of UEs to self assist  Ambulation/Gait Ambulation/Gait assistance: Min assist;Min guard Ambulation Distance (Feet): 75 Feet (twice and 15' from bathroom) Assistive device: Rolling walker (2 wheeled) Gait Pattern/deviations: Step-to pattern;Trunk flexed;Decreased step length - right;Decreased step length - left     General Gait Details: cues for sequence, posture and position from [employer]    Modified Rankin (Stroke Patients Only)       Balance                                    Cognition Arousal/Alertness: Awake/alert Behavior During Therapy: WFL for tasks assessed/performed Overall Cognitive Status: Within Functional Limits for tasks assessed                      Exercises      General  Comments        Pertinent Vitals/Pain 3/10; premed    Home Living                      Prior Function            PT Goals (current goals can now be found in the care plan section) Acute Rehab PT Goals Patient Stated Goal: Rehab and resume previous lifestyle with decreased pain PT Goal Formulation: With patient Potential to Achieve Goals: Good Progress towards PT goals: Progressing toward goals    Frequency  7X/week    PT Plan Current plan remains appropriate    Co-evaluation             End of Session Equipment Utilized During Treatment: Gait belt;Right knee immobilizer Activity Tolerance: Patient tolerated treatment well Patient left: in chair;with call bell/phone within reach;with family/visitor present     Time: 1345-1413 PT Time Calculation (min): 28 min  Charges:  $Gait Training: 23-37 mins                    G Codes:      [name] 04-Oct-2013, 4:27 PM

## 2013-09-22 NOTE — Progress Notes (Signed)
Physical Therapy Treatment Patient Details Name: Allison [name] MRN: [id number] DOB: 02-Feb-1946 Today's Date: Sep 24, 2013    History of Present Illness R TKR    PT Comments    Pt progressing but continues ltd by elevated level of anxiety  Follow Up Recommendations  SNF     Equipment Recommendations  Rolling walker with 5" wheels    Recommendations for Other Services OT consult     Precautions / Restrictions Precautions Precautions: Fall;Knee Required Braces or Orthoses: Knee Immobilizer - Right Knee Immobilizer - Right: Discontinue once straight leg raise with < 10 degree lag Restrictions Weight Bearing Restrictions: No Other Position/Activity Restrictions: WBAT    Mobility  Bed Mobility Overal bed mobility: Needs Assistance Bed Mobility: Supine to Sit     Supine to sit: Min assist     General bed mobility comments: cues for sequence and use of L LE to self assist  Transfers Overall transfer level: Needs assistance Equipment used: Rolling walker (2 wheeled) Transfers: Sit to/from Stand Sit to Stand: Min assist         General transfer comment: cues for LE management and use of UEs to self assist  Ambulation/Gait Ambulation/Gait assistance: Min assist Ambulation Distance (Feet): 75 Feet Assistive device: Rolling walker (2 wheeled) Gait Pattern/deviations: Step-to pattern;Decreased step length - right;Decreased step length - left;Shuffle;Trunk flexed Gait velocity: decr   General Gait Details: cues for sequence, posture and position from [employer]            Wheelchair Mobility    Modified Rankin (Stroke Patients Only)       Balance                                    Cognition Arousal/Alertness: Awake/alert Behavior During Therapy: WFL for tasks assessed/performed Overall Cognitive Status: Within Functional Limits for tasks assessed                      Exercises Total Joint Exercises Ankle Circles/Pumps:  AROM;Both;15 reps;Supine Quad Sets: AROM;Both;Supine;20 reps Heel Slides: AAROM;Right;Supine;20 reps Straight Leg Raises: AAROM;Right;Supine;20 reps Goniometric ROM: AAROM at R knee - -10 - 40 with ++ muscle guarding    General Comments        Pertinent Vitals/Pain 5/10; premed, ice pack provided    Home Living                      Prior Function            PT Goals (current goals can now be found in the care plan section) Acute Rehab PT Goals Patient Stated Goal: Rehab and resume previous lifestyle with decreased pain PT Goal Formulation: With patient Potential to Achieve Goals: Good Progress towards PT goals: Progressing toward goals    Frequency  7X/week    PT Plan Current plan remains appropriate    Co-evaluation             End of Session Equipment Utilized During Treatment: Gait belt;Right knee immobilizer Activity Tolerance: Patient tolerated treatment well Patient left: with call bell/phone within reach;in chair     Time: 1110-1150 PT Time Calculation (min): 40 min  Charges:  $Gait Training: 8-22 mins $Therapeutic Exercise: 23-37 mins                    G Codes:      [name] Sep 24, 2013,  1:17 PM

## 2013-09-22 NOTE — Progress Notes (Signed)
Subjective: 2 Days Post-Op Procedure(s) (LRB): RIGHT TOTAL KNEE REPLACEMENT (Right) Patient reports pain as 3 on 0-10 scale.    Objective: Vital signs in last 24 hours: Temp:  [98 F (36.7 C)-98.5 F (36.9 C)] 98.5 F (36.9 C) (05/16 0515) Pulse Rate:  [55-73] 70 (05/16 0515) Resp:  [16-18] 16 (05/16 0515) BP: (125-166)/(74-81) 166/81 mmHg (05/16 0515) SpO2:  [96 %-99 %] 97 % (05/16 0515)  Intake/Output from previous day: 05/15 0701 - 05/16 0700 In: 720 [P.O.:720] Out: 1250 [Urine:1250] Intake/Output this shift:     Recent Labs  09/21/13 0411 09/22/13 0517  HGB 10.5* 10.5*    Recent Labs  09/21/13 0411 09/22/13 0517  WBC 12.6* 14.3*  RBC 3.94 3.97  HCT 32.4* 32.6*  PLT 217 208    Recent Labs  09/21/13 0411  NA 140  K 4.1  CL 106  CO2 24  BUN 15  CREATININE 0.81  GLUCOSE 169*  CALCIUM 8.4   No results found for this basename: LABPT, INR,  in the last 72 hours  Neurologically intact Neurovascular intact Sensation intact distally Dorsiflexion/Plantar flexion intact No cellulitis present Compartment soft  Assessment/Plan: 2 Days Post-Op Procedure(s) (LRB): RIGHT TOTAL KNEE REPLACEMENT (Right) Advance diet Discharge to SNF tomorrow Elevate leg. Ultram OK  Allison [name] 09/22/2013, 8:22 AM

## 2013-09-23 ENCOUNTER — Inpatient Hospital Stay
Admission: RE | Admit: 2013-09-23 | Discharge: 2013-10-13 | Disposition: A | Discharge status: Home / Self Care | Payer: Medicare Other | Source: Ambulatory Visit | Attending: Internal Medicine | Admitting: Internal Medicine

## 2013-09-23 LAB — CBC
HCT: 32.9 % — ABNORMAL LOW (ref 36.0–46.0)
Hemoglobin: 10.8 g/dL — ABNORMAL LOW (ref 12.0–15.0)
MCH: 27 pg (ref 26.0–34.0)
MCHC: 32.8 g/dL (ref 30.0–36.0)
MCV: 82.3 fL (ref 78.0–100.0)
PLATELETS: 234 10*3/uL (ref 150–400)
RBC: 4 MIL/uL (ref 3.87–5.11)
RDW: 13.7 % (ref 11.5–15.5)
WBC: 12.5 10*3/uL — ABNORMAL HIGH (ref 4.0–10.5)

## 2013-09-23 MED ORDER — BISACODYL 10 MG RE SUPP
10.0000 mg | Freq: Every day | RECTAL | Status: DC | PRN
  Administered 2013-09-23: 10 mg via RECTAL
  Filled 2013-09-23: qty 1

## 2013-09-23 NOTE — Progress Notes (Signed)
Pt c/o general malaise and reported "they found tick embedded on me when I arrived for surgery, told me to report headaches, etc) T 97.9   No rash noted either around bite site (L ant shoulder)  or chest/back, will continue to monitor.

## 2013-09-23 NOTE — Progress Notes (Signed)
Per MD, Pt ready for d/c.  Notified RN, Pt and facility.  Family aware, as CSW met with everyone yesterday and arranged for d/c today.  Sent d/c summary.  Confirmed receipt of d/c summary.  Facility ready to receive Pt.  Family to provide transportation.  Bernita Raisin, [place] [employer] [phone]

## 2013-09-23 NOTE — Progress Notes (Signed)
Physical Therapy Treatment Patient Details Name: Allison [name] MRN: [id number] DOB: March 12, 1946 Today's Date: October 23, 2013    History of Present Illness R TKR    PT Comments    Pt continues motivated and progressing well with strength and mobility.  Pt limited with knee flexion and ++ muscle guarding with attempts.  Follow Up Recommendations  SNF     Equipment Recommendations  Rolling walker with 5" wheels    Recommendations for Other Services OT consult     Precautions / Restrictions Precautions Precautions: Fall;Knee Required Braces or Orthoses: Knee Immobilizer - Right Knee Immobilizer - Right: Discontinue once straight leg raise with < 10 degree lag Restrictions Weight Bearing Restrictions: No Other Position/Activity Restrictions: WBAT    Mobility  Bed Mobility                  Transfers Overall transfer level: Needs assistance Equipment used: Rolling walker (2 wheeled) Transfers: Sit to/from Stand Sit to Stand: Min guard         General transfer comment: cues for LE management and use of UEs to self assist  Ambulation/Gait Ambulation/Gait assistance: Min guard;Supervision Ambulation Distance (Feet): 147 Feet Assistive device: Rolling walker (2 wheeled) Gait Pattern/deviations: Step-to pattern;Decreased step length - right;Decreased step length - left;Trunk flexed     General Gait Details: cues for sequence, posture and position from [employer]    Modified Rankin (Stroke Patients Only)       Balance                                    Cognition Arousal/Alertness: Awake/alert Behavior During Therapy: WFL for tasks assessed/performed Overall Cognitive Status: Within Functional Limits for tasks assessed                      Exercises Total Joint Exercises Ankle Circles/Pumps: AROM;Both;15 reps;Supine Quad Sets: AROM;Both;Supine;20 reps Heel Slides: AAROM;Right;Supine;15  reps Straight Leg Raises: AAROM;AROM;Right;20 reps;Supine Goniometric ROM: AAROM at R knee - -10 - 40 with ++ muscle guarding    General Comments        Pertinent Vitals/Pain 5/10; premed, cold packs provided    Home Living                      Prior Function            PT Goals (current goals can now be found in the care plan section) Acute Rehab PT Goals Patient Stated Goal: Rehab and resume previous lifestyle with decreased pain PT Goal Formulation: With patient Potential to Achieve Goals: Good Progress towards PT goals: Progressing toward goals    Frequency  7X/week    PT Plan Current plan remains appropriate    Co-evaluation             End of Session Equipment Utilized During Treatment: Right knee immobilizer Activity Tolerance: Patient tolerated treatment well Patient left: in chair;with call bell/phone within reach;with family/visitor present     Time: 3382-5053 PT Time Calculation (min): 30 min  Charges:  $Gait Training: 8-22 mins $Therapeutic Exercise: 8-22 mins                    G Codes:      [name] 10/23/13, 11:30 AM

## 2013-09-23 NOTE — Progress Notes (Signed)
Discharged from floor via w/c, family with pt, to take her to [place]. No changes in assessment. [employer], Therapist, sports

## 2013-09-23 NOTE — Progress Notes (Addendum)
Subjective: 3 Days Post-Op Procedure(s) (LRB): RIGHT TOTAL KNEE REPLACEMENT (Right) Patient reports pain as 3 on 0-10 scale.   Constipation Nausea with pain meds.  Objective: Vital signs in last 24 hours: Temp:  [98.1 F (36.7 C)-98.5 F (36.9 C)] 98.1 F (36.7 C) (05/17 0608) Pulse Rate:  [65-79] 65 (05/17 0608) Resp:  [16] 16 (05/17 0608) BP: (114-142)/(70-76) 142/76 mmHg (05/17 0608) SpO2:  [96 %-97 %] 97 % (05/17 0608)  Intake/Output from previous day: 05/16 0701 - 05/17 0700 In: 480 [P.O.:480] Out: 725 [Urine:725] Intake/Output this shift:     Recent Labs  09/21/13 0411 09/22/13 0517 09/23/13 0517  HGB 10.5* 10.5* 10.8*    Recent Labs  09/22/13 0517 09/23/13 0517  WBC 14.3* 12.5*  RBC 3.97 4.00  HCT 32.6* 32.9*  PLT 208 234    Recent Labs  09/21/13 0411  NA 140  K 4.1  CL 106  CO2 24  BUN 15  CREATININE 0.81  GLUCOSE 169*  CALCIUM 8.4   No results found for this basename: LABPT, INR,  in the last 72 hours  Neurologically intact Intact pulses distally Incision: dressing C/D/I and no drainage Compartment soft  Assessment/Plan: 3 Days Post-Op Procedure(s) (LRB): RIGHT TOTAL KNEE REPLACEMENT (Right) Advance diet Up with therapy Discharge to SNF if doing well. Dulcolox  Johnn Hai 09/23/2013, 7:29 AM

## 2013-09-23 NOTE — Progress Notes (Signed)
Report called to [facility]. Family will be taking pt in their [place]. [employer], Therapist, sports

## 2013-09-23 NOTE — Plan of Care (Signed)
Problem: Discharge Progression Outcomes Goal: Anticoagulant follow-up in place Outcome: Not Applicable Date Met:  37/90/24 xarelto Goal: Complications resolved/controlled Outcome: Completed/Met Date Met:  09/23/13 Had BM, nausea improved

## 2013-09-26 ENCOUNTER — Non-Acute Institutional Stay (SKILLED_NURSING_FACILITY): Payer: Medicare Other | Admitting: Internal Medicine

## 2013-09-26 DIAGNOSIS — R110 Nausea: Secondary | ICD-10-CM

## 2013-09-26 DIAGNOSIS — F330 Major depressive disorder, recurrent, mild: Secondary | ICD-10-CM

## 2013-09-26 DIAGNOSIS — K219 Gastro-esophageal reflux disease without esophagitis: Secondary | ICD-10-CM

## 2013-09-26 DIAGNOSIS — Z96659 Presence of unspecified artificial knee joint: Secondary | ICD-10-CM

## 2013-09-28 NOTE — Care Management Note (Signed)
    Page 1 of 1   09/28/2013     1:57:08 PM CARE MANAGEMENT NOTE 09/28/2013  Patient:  Allison [name], Allison [name]   Account Number:  [id number]  Date Initiated:  09/28/2013  Documentation initiated by:  [facility]  Subjective/Objective Assessment:     Action/Plan:   Anticipated DC Date:  09/23/2013   Anticipated DC Plan:  SKILLED NURSING FACILITY         Choice offered to / List presented to:             Status of service:  Completed, signed off Medicare Important Message given?   (If response is "NO", the following Medicare IM given date fields will be blank) Date Medicare IM given:   Date Additional Medicare IM given:    Discharge Disposition:  [place]  Per UR Regulation:  Reviewed for med. necessity/level of care/duration of stay  If discussed at [place] of Stay Meetings, dates discussed:    Comments:

## 2013-10-01 NOTE — Progress Notes (Addendum)
Patient ID: Allison [name], female   DOB: 1945/11/19, 68 y.o.   MRN: [id number]                  HISTORY & PHYSICAL  DATE:  09/26/2013    FACILITY: [facility]    LEVEL OF CARE:   SNF   CHIEF COMPLAINT:  Status post admission to [facility], 09/20/2013 through 09/23/2013.  HISTORY OF PRESENT ILLNESS:  This is a patient who was admitted electively for a right total knee replacement.   She appears to have tolerated the procedure reasonably well.  She was started on Xarelto for DVT prophylaxis.     Since she has come here for rehabilitation, she has been hampered by pain and nausea and I think by significant anxiety.  I have been asked to review her for this.    PAST MEDICAL HISTORY/PROBLEM LIST:  Includes:    Hypertension.     Irritable bowel, predominant constipation.     Gastroesophageal reflux.    Anxiety.    Heart murmur.    Hypercholesterolemia.    Hiatal hernia with gastroesophageal reflux.    Diverticulosis.    UTI six weeks ago, treated with Cipro.    Degenerative arthritis of her right knee greater than left.  She has no particular shoulder problems.    CURRENT MEDICATIONS:  Discharge medications include:      Tylenol 1000 mg every 6 hours p.r.n.    Ziac 10/6.5, 1 tablet every morning.     Coenzyme-Q b.i.d.    Colace 100 b.i.d.    Omeprazole 20 q.d.    Zofran 8 mg every 8 hours.    Xarelto 10 mg daily for DVT prophylaxis for 30 days.    Zocor 20 q.d.     Tramadol 50 mg every 6 hours.    SOCIAL HISTORY:   HOUSING:  The patient tells me she lives in [place] in her own home.   She has stairs to her bedroom, although this can be put to the ground floor.  There is a bathroom.   FUNCTIONAL STATUS:  She appears to have good family support.    REVIEW OF SYSTEMS:   CHEST/RESPIRATORY:  No shortness of breath.  No history of asthma.  No smoking history.   CARDIAC:   No chest pain.   GI:  Complaining of some constipation, although  she had a bowel movement yesterday.   GU:  No dysuria.    PHYSICAL EXAMINATION:   GENERAL APPEARANCE:  The patient is not in any distress.   CHEST/RESPIRATORY:  Clear air entry bilaterally.   CARDIOVASCULAR:  CARDIAC:   Heart sounds are normal.  I did not appreciate a murmur with the patient upright sitting.   GASTROINTESTINAL:  ABDOMEN:   Soft.  There is no tenderness.   MUSCULOSKELETAL:   EXTREMITIES:   RIGHT LOWER EXTREMITY:  The right knee incision is well approximated.  There is minimal swelling, certainly no evidence of infection.   In the distal right leg, there is no evidence of a DVT.    ASSESSMENT/PLAN:  Elective total knee replacement on the right.  I think the patient is hampered by some degree of anxiety, unexpected pain.  I spent some time counseling her on this.    Nausea without vomiting.  I am going to put her on routine Reglan for 10 days.  She does have irritable bowel syndrome, although her bowels are moving.    Hypertension.  We will monitor while  she is here.    Gastroesophageal reflux.  She does not appear to be unduly symptomatic at the moment.    Anxiety without a history of depression.  I think the patient is simply overwhelmed.  I am going to leave her p.r.n. Ativan.

## 2013-10-03 ENCOUNTER — Other Ambulatory Visit: Payer: Self-pay | Admitting: *Deleted

## 2013-10-03 MED ORDER — TRAMADOL HCL 50 MG PO TABS: ORAL_TABLET | ORAL | Status: DC

## 2013-10-03 MED ORDER — LORAZEPAM 0.5 MG PO TABS: ORAL_TABLET | ORAL | Status: DC

## 2013-10-03 NOTE — Telephone Encounter (Signed)
[facility] 

## 2013-10-11 ENCOUNTER — Encounter: Payer: Self-pay | Admitting: Internal Medicine

## 2013-10-11 ENCOUNTER — Non-Acute Institutional Stay (SKILLED_NURSING_FACILITY): Payer: Medicare Other | Admitting: Internal Medicine

## 2013-10-11 DIAGNOSIS — M1710 Unilateral primary osteoarthritis, unspecified knee: Secondary | ICD-10-CM

## 2013-10-11 DIAGNOSIS — M1711 Unilateral primary osteoarthritis, right knee: Secondary | ICD-10-CM

## 2013-10-11 DIAGNOSIS — IMO0002 Reserved for concepts with insufficient information to code with codable children: Secondary | ICD-10-CM

## 2013-10-11 DIAGNOSIS — F411 Generalized anxiety disorder: Secondary | ICD-10-CM

## 2013-10-11 DIAGNOSIS — I10 Essential (primary) hypertension: Secondary | ICD-10-CM

## 2013-10-11 DIAGNOSIS — D649 Anemia, unspecified: Secondary | ICD-10-CM | POA: Insufficient documentation

## 2013-10-11 DIAGNOSIS — R1319 Other dysphagia: Secondary | ICD-10-CM

## 2013-10-11 NOTE — Progress Notes (Signed)
Patient ID: Allison [name], female   DOB: 10-17-1945, 69 y.o.   MRN: [id number]   FACILITY: [facility]  LEVEL OF CARE: SNF This is a discharge note   CHIEF COMPLAINT discharge no:   HISTORY OF PRESENT ILLNESS: This is a patient who was admitted electively for a right total knee replacement. She appears to have tolerated the procedure reasonably well. She was started on Xarelto for DVT prophylaxis--this has been switched to aspirin.  Since she has come here for rehabilitation, she was hampered by pain and nausea and I think by significant anxiety. However this appears improved on when necessary Ativan every 8 hours she also received a short course of Reglan--she has progressed well with physical therapy and will need continued therapy as an outpatient-she does have significant support at home from her 2 sons and their spouses-although she will be living alone.  PAST MEDICAL HISTORY/PROBLEM LIST: Includes:  Hypertension.  Irritable bowel, predominant constipation.  Gastroesophageal reflux.  Anxiety.  Heart murmur.  Hypercholesterolemia.  Hiatal hernia with gastroesophageal reflux.  Diverticulosis.  UTI six weeks ago, treated with Cipro.  Degenerative arthritis of her right knee greater than left. She has no particular shoulder problems.  CURRENT MEDICATIONS: Discharge medications include:  Tylenol 1000 mg every 6 hours p.r.n.  Ziac 10/6.5, 1 tablet every morning.  Coenzyme-Q b.i.d.  Colace 100 b.i.d.  Omeprazole 20 q.d.  Zofran 8 mg every 8 hours.prn  Aspirin 325 mg daily.  Zocor 20 q.d.  Tramadol 50 mg every 6 hours .  SOCIAL HISTORY:  HOUSING: The patient tells me she lives in [place] in her own home. She has stairs to her bedroom, although this can be put to the ground floor. There is a bathroom.   FUNCTIONAL STATUS: She appears to have good family suppor t.  REVIEW OF SYSTEMS: Gen. no complaints of fever or chills Skin-complains of a small rash midneck area  apparently some itching at times.  Head ears eyes nose mouth and throat does not complaining of visual changes or sore throat  CHEST/RESPIRATORY: No shortness of breath. No history of asthma. No smoking history.  CARDIAC: No chest pain.  GI: Is not complaining of nausea vomiting diarrhea constipation or abdominal discomfort.  GU: No dysuria. Muscle-l skeletal appears to have done well status post surgery pain is controlled on current medications  Neurologic does not complaining of dizziness headache or syncopal-type feelings.  Psych she does have a significant history of anxiety this appears to be somewhat better controlled   PHYSICAL EXAMINATION Temperature 97.9 pulse 69 respirations 20 blood pressure 127/84 weight is 171.8 :  GENERAL APPEARANCE: The patient is not in any distress sitting confluently on the side of her bed.  Her skin is warm and dry there is a small rash anterior midneck area -this is quite pale erythema no signs of cellulitis.  Eyes she has prescription lenses pupils. Coulomb reactive to light sclera and conjunctiva are clear visual acuity appears intact.  Oropharynx clear mucous membranes moist.  .  CHEST/RESPIRATORY: Clear air entry bilaterally.  CARDIOVASCULAR:  CARDIAC: Heart sounds are mostly regular with infrequent irregular beats--she has trace lower extremity edema.  GASTROINTESTINAL:  ABDOMEN: Soft. There is no tenderness bowel sounds are positive.  MUSCULOSKELETAL:  EXTREMITIES:  She is able to ambulate with a walker and does fairly well with this but would benefit from continued therapy-I do not note any deformities of her extremities strength appears to be grossly intact.  Neurologic is grossly intact her  speech is clear no lateralizing findings.  Psych she is alert and oriented appears to be slightly anxious   Labs. 09/23/2013.  CBC 12.5 hemoglobin 10.8 platelets 234.  Sodium 140 potassium 4.1 BUN 15 creatinine 0.81 glucose 169.  .   .    ASSESSMENT/PLAN:  Elective total knee replacement on the right.--has recovered well from this--would benefit from continued out patient therapy   Nausea without vomiting--dysphagia.--This appears to have largely resolved .  Hypertension. This appears stable  On  current medication Will update metabolic panel.  Gastroesophageal reflux. She does not appear to be unduly symptomatic at the moment  on Prilosec .  Anxiety without a history of depression. She has been followed by psychiatric services while here and he is on Ativan apparently with significant relief  Anemia-we'll update CBC before discharge.  Elevated glucose a metabolic panel-will check a fasting glucose.  On discharge patient will need continued PT and OT as well as RN support-  Occasional ectopic beats on cardiac exam--will check EKG  Neck rash--treat empiracally with steroid cream    .   [id number] note greater than 30 minutes spent on this discharge summary

## 2013-10-23 ENCOUNTER — Ambulatory Visit (HOSPITAL_COMMUNITY): Payer: Medicare Other | Admitting: Physical Therapy

## 2013-10-30 ENCOUNTER — Encounter (INDEPENDENT_AMBULATORY_CARE_PROVIDER_SITE_OTHER): Payer: Self-pay | Admitting: Internal Medicine

## 2013-10-30 ENCOUNTER — Ambulatory Visit (INDEPENDENT_AMBULATORY_CARE_PROVIDER_SITE_OTHER): Payer: Medicare Other | Admitting: Internal Medicine

## 2013-10-30 VITALS — BP 118/74 | HR 76 | Temp 97.7°F | Resp 18 | Ht 64.0 in | Wt 168.4 lb

## 2013-10-30 DIAGNOSIS — K589 Irritable bowel syndrome without diarrhea: Secondary | ICD-10-CM

## 2013-10-30 DIAGNOSIS — K219 Gastro-esophageal reflux disease without esophagitis: Secondary | ICD-10-CM

## 2013-10-30 NOTE — Patient Instructions (Signed)
Notify if you have abdominal pain, rectal bleeding or tarry stool. Remember to take aspirin with food

## 2013-10-30 NOTE — Progress Notes (Signed)
Presenting complaint;  Followup for IBS and GERD.  Subjective:  Patient is 68 year old Caucasian female who has IBS and GERD and is here for scheduled visit. She was last seen in our office one year ago. She lost her dad because of dementia 2 weeks ago and hasn't felt well since then. She feels"stomach is torn up" when she has to have a bowel movement. She is having anywhere from one to 3 bowel movements per day. More upset she is more bowel movement she has. She denies pain melena or rectal bleeding. She had a right knee replacement on 09/20/2013. She was on warfarin for a few weeks and now she is on full dose aspirin 1 daily. She stopped this medication 2 days ago wearing that she may end up with peptic ulcer. However she is not experiencing nausea vomiting or epigastric pain. He rarely experiences heartburn as long as she takes omeprazole.    Current Medications: Outpatient Encounter Prescriptions as of 10/30/2013  Medication Sig  . acetaminophen (TYLENOL) 500 MG tablet Take 1,000 mg by mouth every 6 (six) hours as needed for mild pain.   Marland Kitchen aspirin 325 MG tablet Take 325 mg by mouth daily.  . bisoprolol-hydrochlorothiazide (ZIAC) 10-6.25 MG per tablet Take 1 tablet by mouth every morning.   . Coenzyme Q10 (CO Q 10) 100 MG CAPS Take 1 capsule by mouth 2 (two) times daily.  Marland Kitchen omeprazole (PRILOSEC) 20 MG capsule Take 20 mg by mouth daily.  . simvastatin (ZOCOR) 20 MG tablet Take 20 mg by mouth at bedtime.    . traMADol (ULTRAM) 50 MG tablet Take one tablet by mouth every 6 hours as needed for pain and take two tablets along with Tylenol prior as needed for pain.  Marland Kitchen LORazepam (ATIVAN) 0.5 MG tablet Take one tablet by mouth every 8 hours as needed for anxiety  . [DISCONTINUED] docusate sodium (COLACE) 100 MG capsule Take 1 capsule (100 mg total) by mouth 2 (two) times daily as needed for mild constipation.  . [DISCONTINUED] ondansetron (ZOFRAN) 8 MG tablet Take 1 tablet (8 mg total) by mouth  every 8 (eight) hours as needed for nausea or vomiting.     Objective: Blood pressure 118/74, pulse 76, temperature 97.7 F (36.5 C), temperature source Oral, resp. rate 18, height 5\' 4"  (1.626 m), weight 168 lb 6.4 oz (76.386 kg). Patient is alert and in no acute distress. Conjunctiva is pink. Sclera is nonicteric Oropharyngeal mucosa is normal. No neck masses or thyromegaly noted. Cardiac exam with regular rhythm normal S1 and S2. No murmur or gallop noted. Lungs are clear to auscultation. Abdomen symmetrical. Bowel sounds are normal. Abdomen is soft and nontender without organomegaly or masses.  Right knee is swollen and warm to touch. No LE edema or clubbing noted.  Prior GI studies; EGD on 08/20/2010.   Small sliding hiatal hernia and few 2-5 mm hyperplastic          polyps. Nonerosive antral gastritis.  H. pylori serology was negative on 08/19/2010.  Colonoscopy on 04/12/2012.  Data diverticula and sigmoid and transverse colon.  External hemorrhoids and anal papillae.   Assessment:  #1. Irritable bowel syndrome. She is having flare up secondary to posttraumatic stress resulting from her father's passing away 2 weeks ago. If symptoms get worse will begin anti-spasmodic therapy. Will monitor symptoms for now. #2. GERD. Symptoms well-controlled with therapy.   Plan:  Patient will continue anti-reflex measures and omeprazole as before. Patient advised to take at least 2 baby  aspirin Staley and to she's been seen by Dr. Maxie Better. She will call if she has abdominal pain melena or rectal bleeding. Unless symptoms progress she'll return for office visit in one year.

## 2014-01-08 ENCOUNTER — Other Ambulatory Visit (HOSPITAL_COMMUNITY): Payer: Self-pay | Admitting: Internal Medicine

## 2014-01-08 DIAGNOSIS — M810 Age-related osteoporosis without current pathological fracture: Secondary | ICD-10-CM

## 2014-01-24 ENCOUNTER — Other Ambulatory Visit (HOSPITAL_COMMUNITY): Payer: Medicare Other

## 2014-02-20 ENCOUNTER — Ambulatory Visit (HOSPITAL_COMMUNITY)
Admission: RE | Admit: 2014-02-20 | Discharge: 2014-02-20 | Disposition: A | Discharge status: Home / Self Care | Payer: Medicare Other | Source: Ambulatory Visit | Attending: Internal Medicine | Admitting: Internal Medicine

## 2014-02-20 DIAGNOSIS — M810 Age-related osteoporosis without current pathological fracture: Secondary | ICD-10-CM | POA: Diagnosis present

## 2014-08-27 ENCOUNTER — Other Ambulatory Visit (INDEPENDENT_AMBULATORY_CARE_PROVIDER_SITE_OTHER): Payer: Self-pay | Admitting: Internal Medicine

## 2014-08-28 ENCOUNTER — Encounter (INDEPENDENT_AMBULATORY_CARE_PROVIDER_SITE_OTHER): Payer: Self-pay | Admitting: *Deleted

## 2014-10-31 ENCOUNTER — Ambulatory Visit (INDEPENDENT_AMBULATORY_CARE_PROVIDER_SITE_OTHER): Payer: Self-pay | Admitting: Internal Medicine

## 2014-11-28 ENCOUNTER — Ambulatory Visit (INDEPENDENT_AMBULATORY_CARE_PROVIDER_SITE_OTHER): Payer: Medicare Other | Admitting: Internal Medicine

## 2014-11-28 ENCOUNTER — Encounter (INDEPENDENT_AMBULATORY_CARE_PROVIDER_SITE_OTHER): Payer: Self-pay | Admitting: Internal Medicine

## 2014-11-28 VITALS — BP 132/70 | HR 64 | Temp 97.9°F | Ht 64.0 in | Wt 195.9 lb

## 2014-11-28 DIAGNOSIS — K589 Irritable bowel syndrome without diarrhea: Secondary | ICD-10-CM | POA: Diagnosis not present

## 2014-11-28 DIAGNOSIS — K219 Gastro-esophageal reflux disease without esophagitis: Secondary | ICD-10-CM

## 2014-11-28 NOTE — Progress Notes (Signed)
Subjective:    Patient ID: Allison [name], female    DOB: 06-Sep-1945, 69 y.o.   MRN: [id number]  HPI Here today for f/u. She was last seen by Dr. Laural Golden one year ago. Hx of IBS and GERD.  She is doing good. Acid reflux is controlled with Omeprazole.  She tells me if she becomes nervous or upset she is running to the BR. She says she rarely has a normal BM.  She feels so much better than she did 4 yrs ago.    Rt knee replacement 09/20/2013. She says she is doing well with this.     EGD on 08/20/2010.  Small sliding hiatal hernia and few 2-5 mm hyperplastic polyps. Nonerosive antral gastritis.  H. pylori serology was negative on 08/19/2010.  Colonoscopy on 04/12/2012. Data diverticula and sigmoid and transverse colon. External hemorrhoids and anal papillae.  Review of Systems Past Medical History  Diagnosis Date  . Hypertension   . Irritable bowel syndrome   . Gastroesophageal reflux   . Anxiety   . Heart murmur   . Hypercholesteremia   . Hemorrhoid     hx. of  . H/O hiatal hernia   . Diverticulosis   . UTI symptoms     6 weeks ago-tx. Cipro-asymptomatic now  . Arthritis     degenerative joint disease    Past Surgical History  Procedure Laterality Date  . Esophagogastroduodenoscopy  08/19/2010  . Esophagogastroduodenoscopy  09/24/2008  . Colonoscopy  08/11/2001  . Colonoscopy  04/12/2012    Procedure: COLONOSCOPY;  Surgeon: Rogene Houston, MD;  Location: [facility];  Service: Endoscopy;  Laterality: N/A;  1030  . Cesarean section      '73, '76   . Breast lumpectomy Left     more axilla area benign  . Cholecystectomy      '08- laparoscopic  . Abdominal hysterectomy      '91 uterus and 1 ovary, '08- other ovary removed.  . Total knee arthroplasty Right 09/20/2013    Procedure: RIGHT TOTAL KNEE REPLACEMENT;  Surgeon: Johnn Hai, MD;  Location: [facility];  Service: Orthopedics;  Laterality: Right;    Allergies  Allergen Reactions  . Codeine       REACTION: Unknown reaction  . Demerol [Meperidine] Nausea And Vomiting  . Sulfonamide Derivatives     REACTION: Unknown reaction    Current Outpatient Prescriptions on File Prior to Visit  Medication Sig Dispense Refill  . acetaminophen (TYLENOL) 500 MG tablet Take 1,000 mg by mouth every 6 (six) hours as needed for mild pain.     Marland Kitchen aspirin 325 MG tablet Take 325 mg by mouth daily.    . bisoprolol-hydrochlorothiazide (ZIAC) 10-6.25 MG per tablet Take 1 tablet by mouth every morning.     . Coenzyme Q10 (CO Q 10) 100 MG CAPS Take 1 capsule by mouth 2 (two) times daily.    Marland Kitchen omeprazole (PRILOSEC) 20 MG capsule Take 20 mg by mouth daily.    Marland Kitchen omeprazole (PRILOSEC) 20 MG capsule TAKE ONE (1) CAPSULE EACH DAY 90 capsule 3  . simvastatin (ZOCOR) 20 MG tablet Take 20 mg by mouth at bedtime.      Marland Kitchen LORazepam (ATIVAN) 0.5 MG tablet Take one tablet by mouth every 8 hours as needed for anxiety (Patient not taking: Reported on 11/28/2014) 90 tablet 5  . traMADol (ULTRAM) 50 MG tablet Take one tablet by mouth every 6 hours as needed for pain and take two tablets along with  Tylenol prior as needed for pain. (Patient not taking: Reported on 11/28/2014) 180 tablet 5   No current facility-administered medications on file prior to visit.        Objective:   Physical Exam Blood pressure 132/70, pulse 64, temperature 97.9 F (36.6 C), height 5\' 4"  (1.626 m), weight 195 lb 14.4 oz (88.86 kg).  Alert and oriented. Skin warm and dry. Oral mucosa is moist.   . Sclera anicteric, conjunctivae is pink. Thyroid not enlarged. No cervical lymphadenopathy. Lungs clear. Heart regular rate and rhythm.  Abdomen is soft. Bowel sounds are positive. No hepatomegaly. No abdominal masses felt. No tenderness.  No edema to lower extremities.         Assessment & Plan:  GERD, controlled at this time. IBS: she is doing well. No complaints. She says if she becomes upset or nervous she will have IBS. OV in 1 year.

## 2014-11-28 NOTE — Patient Instructions (Signed)
OV in 1 year.  

## 2015-08-19 ENCOUNTER — Other Ambulatory Visit (INDEPENDENT_AMBULATORY_CARE_PROVIDER_SITE_OTHER): Payer: Self-pay | Admitting: Internal Medicine

## 2015-09-05 ENCOUNTER — Encounter (INDEPENDENT_AMBULATORY_CARE_PROVIDER_SITE_OTHER): Payer: Self-pay | Admitting: *Deleted

## 2015-11-17 ENCOUNTER — Ambulatory Visit (INDEPENDENT_AMBULATORY_CARE_PROVIDER_SITE_OTHER): Payer: Medicare Other | Admitting: Internal Medicine

## 2015-12-01 ENCOUNTER — Encounter (INDEPENDENT_AMBULATORY_CARE_PROVIDER_SITE_OTHER): Payer: Self-pay | Admitting: Internal Medicine

## 2015-12-01 ENCOUNTER — Ambulatory Visit (INDEPENDENT_AMBULATORY_CARE_PROVIDER_SITE_OTHER): Payer: Medicare Other | Admitting: Internal Medicine

## 2015-12-01 VITALS — BP 164/92 | HR 64 | Ht 64.0 in | Wt 202.3 lb

## 2015-12-01 DIAGNOSIS — K589 Irritable bowel syndrome without diarrhea: Secondary | ICD-10-CM | POA: Diagnosis not present

## 2015-12-01 DIAGNOSIS — K219 Gastro-esophageal reflux disease without esophagitis: Secondary | ICD-10-CM | POA: Diagnosis not present

## 2015-12-01 NOTE — Progress Notes (Signed)
Subjective:    Patient ID: Allison [name], female    DOB: March 14, 1946, 70 y.o.   MRN: [id number]  HPI Here today for f/u of her GERD and IBS. She was last seen by Dr Laural Golden in 2015. Weight June of 2015 was 168.  She tells me her acid reflux is controlled with Omeprazole.  Her appetite is good. She has gained from 168 to 202.3.  She has a BM daily. She does occasionally have IBS if she becomes worried.  Her stools are usually loose.  No melena or BRRB. Rt knee replacement in 2015.  She is very upset today due to her weight gain.         Prior GI studies; EGD on 08/20/2010.   Small sliding hiatal hernia and few 2-5 mm hyperplastic          polyps. Nonerosive antral gastritis.  H. pylori serology was negative on 08/19/2010.  Colonoscopy on 04/12/2012.  Data diverticula and sigmoid and transverse colon.  External hemorrhoids and anal papillae.  Review of Systems Past Medical History:  Diagnosis Date  . Anxiety   . Arthritis    degenerative joint disease  . Diverticulosis   . Gastroesophageal reflux   . H/O hiatal hernia   . Heart murmur   . Hemorrhoid    hx. of  . Hypercholesteremia   . Hypertension   . Irritable bowel syndrome   . UTI symptoms    6 weeks ago-tx. Cipro-asymptomatic now    Past Surgical History:  Procedure Laterality Date  . ABDOMINAL HYSTERECTOMY     '91 uterus and 1 ovary, '08- other ovary removed.  Marland Kitchen BREAST LUMPECTOMY Left    more axilla area benign  . CESAREAN SECTION     '73, '76   . CHOLECYSTECTOMY     '08- laparoscopic  . COLONOSCOPY  08/11/2001  . COLONOSCOPY  04/12/2012   Procedure: COLONOSCOPY;  Surgeon: Rogene Houston, MD;  Location: [facility];  Service: Endoscopy;  Laterality: N/A;  1030  . ESOPHAGOGASTRODUODENOSCOPY  08/19/2010  . ESOPHAGOGASTRODUODENOSCOPY  09/24/2008  . TOTAL KNEE ARTHROPLASTY Right 09/20/2013   Procedure: RIGHT TOTAL KNEE REPLACEMENT;  Surgeon: Johnn Hai, MD;  Location: [facility];  Service: Orthopedics;   Laterality: Right;    Allergies  Allergen Reactions  . Codeine     REACTION: Unknown reaction  . Demerol [Meperidine] Nausea And Vomiting  . Sulfonamide Derivatives     REACTION: Unknown reaction    Current Outpatient Prescriptions on File Prior to Visit  Medication Sig Dispense Refill  . acetaminophen (TYLENOL) 500 MG tablet Take 1,000 mg by mouth every 6 (six) hours as needed for mild pain.     . bisoprolol-hydrochlorothiazide (ZIAC) 10-6.25 MG per tablet Take 1 tablet by mouth every morning.     . Coenzyme Q10 (CO Q 10) 100 MG CAPS Take 1 capsule by mouth 2 (two) times daily.    . multivitamin-iron-minerals-folic acid (CENTRUM) chewable tablet Chew 1 tablet by mouth daily.    Marland Kitchen omeprazole (PRILOSEC) 20 MG capsule Take 20 mg by mouth daily.    . simvastatin (ZOCOR) 20 MG tablet Take 20 mg by mouth at bedtime.      Marland Kitchen aspirin 325 MG tablet Take 81 mg by mouth daily.     Marland Kitchen LORazepam (ATIVAN) 0.5 MG tablet Take one tablet by mouth every 8 hours as needed for anxiety (Patient not taking: Reported on 11/28/2014) 90 tablet 5  . omeprazole (PRILOSEC) 20 MG capsule TAKE  ONE (1) CAPSULE EACH DAY (Patient not taking: Reported on 12/01/2015) 90 capsule 3  . traMADol (ULTRAM) 50 MG tablet Take one tablet by mouth every 6 hours as needed for pain and take two tablets along with Tylenol prior as needed for pain. (Patient not taking: Reported on 11/28/2014) 180 tablet 5   No current facility-administered medications on file prior to visit.        Objective:   Physical Exam Blood pressure (!) 164/92, pulse 64, height 5\' 4"  (1.626 m), weight 202 lb 4.8 oz (91.8 kg). Alert and oriented. Skin warm and dry. Oral mucosa is moist.   . Sclera anicteric, conjunctivae is pink. Thyroid not enlarged. No cervical lymphadenopathy. Lungs clear. Heart regular rate and rhythm.  Abdomen is soft. Bowel sounds are positive. No hepatomegaly. No abdominal masses felt. No tenderness.  No edema to lower extremities.          Assessment & Plan:  GERD controlled with Omeprazole. IBS:  Symptoms only when she becomes upset. Plan: Continue the Omeprazole.  OV in 1 year. Try to diet and exercise.

## 2015-12-01 NOTE — Patient Instructions (Signed)
OV in 1 year.  

## 2016-08-04 ENCOUNTER — Other Ambulatory Visit (INDEPENDENT_AMBULATORY_CARE_PROVIDER_SITE_OTHER): Payer: Self-pay | Admitting: Internal Medicine

## 2016-10-28 ENCOUNTER — Encounter (INDEPENDENT_AMBULATORY_CARE_PROVIDER_SITE_OTHER): Payer: Self-pay

## 2016-10-28 ENCOUNTER — Encounter (INDEPENDENT_AMBULATORY_CARE_PROVIDER_SITE_OTHER): Payer: Self-pay | Admitting: Internal Medicine

## 2016-11-30 ENCOUNTER — Ambulatory Visit (INDEPENDENT_AMBULATORY_CARE_PROVIDER_SITE_OTHER): Payer: Medicare Other | Admitting: Internal Medicine

## 2016-11-30 ENCOUNTER — Encounter (INDEPENDENT_AMBULATORY_CARE_PROVIDER_SITE_OTHER): Payer: Self-pay

## 2016-11-30 ENCOUNTER — Encounter (INDEPENDENT_AMBULATORY_CARE_PROVIDER_SITE_OTHER): Payer: Self-pay | Admitting: Internal Medicine

## 2016-11-30 VITALS — BP 180/80 | HR 72 | Temp 98.0°F | Ht 64.0 in | Wt 177.9 lb

## 2016-11-30 DIAGNOSIS — K219 Gastro-esophageal reflux disease without esophagitis: Secondary | ICD-10-CM

## 2016-11-30 MED ORDER — OMEPRAZOLE 20 MG PO CPDR: DELAYED_RELEASE_CAPSULE | ORAL | 5 refills | Status: DC

## 2016-11-30 NOTE — Patient Instructions (Signed)
OV in 1 year.  

## 2016-11-30 NOTE — Progress Notes (Signed)
Subjective:    Patient ID: Allison [name], female    DOB: 12-11-1945, 71 y.o.   MRN: [id number]  HPI Here today for f/u. Last seen In July of 2017 by me. Weight  11/2015 202. Hx of GERD. She is doing good. She has had weight loss of 25 pounds which was intentional. Appetite is good. No abdominal pain.  Has a BM dialy. No melena or BRRB. Acid reflux controlled with Omeprazole.   Rt knee replacement in 2015.     Prior GI studies; EGD on 08/20/2010. Small sliding hiatal hernia and few 2-5 mm hyperplastic polyps. Nonerosive antral gastritis.  H. pylori serology was negative on 08/19/2010.  Colonoscopy on 04/12/2012. Data diverticula and sigmoid and transverse colon. External hemorrhoids and anal papillae.    Review of Systems Past Medical History:  Diagnosis Date  . Anxiety   . Arthritis    degenerative joint disease  . Diverticulosis   . Gastroesophageal reflux   . H/O hiatal hernia   . Heart murmur   . Hemorrhoid    hx. of  . Hypercholesteremia   . Hypertension   . Irritable bowel syndrome   . UTI symptoms    6 weeks ago-tx. Cipro-asymptomatic now    Past Surgical History:  Procedure Laterality Date  . ABDOMINAL HYSTERECTOMY     '91 uterus and 1 ovary, '08- other ovary removed.  Marland Kitchen BREAST LUMPECTOMY Left    more axilla area benign  . CESAREAN SECTION     '73, '76   . CHOLECYSTECTOMY     '08- laparoscopic  . COLONOSCOPY  08/11/2001  . COLONOSCOPY  04/12/2012   Procedure: COLONOSCOPY;  Surgeon: Rogene Houston, MD;  Location: [facility];  Service: Endoscopy;  Laterality: N/A;  1030  . ESOPHAGOGASTRODUODENOSCOPY  08/19/2010  . ESOPHAGOGASTRODUODENOSCOPY  09/24/2008  . TOTAL KNEE ARTHROPLASTY Right 09/20/2013   Procedure: RIGHT TOTAL KNEE REPLACEMENT;  Surgeon: Johnn Hai, MD;  Location: [facility];  Service: Orthopedics;  Laterality: Right;    Allergies  Allergen Reactions  . Codeine     REACTION: Unknown reaction  . Demerol  [Meperidine] Nausea And Vomiting  . Sulfonamide Derivatives     REACTION: Unknown reaction    Current Outpatient Prescriptions on File Prior to Visit  Medication Sig Dispense Refill  . acetaminophen (TYLENOL) 500 MG tablet Take 1,000 mg by mouth every 6 (six) hours as needed for mild pain.     Marland Kitchen aspirin 325 MG tablet Take 81 mg by mouth daily.     . bisoprolol-hydrochlorothiazide (ZIAC) 10-6.25 MG per tablet Take 1 tablet by mouth every morning.     . Coenzyme Q10 (CO Q 10) 100 MG CAPS Take 1 capsule by mouth 2 (two) times daily.    . multivitamin-iron-minerals-folic acid (CENTRUM) chewable tablet Chew 1 tablet by mouth daily.    . Omega-3 Fatty Acids (FISH OIL) 1200 MG CAPS Take by mouth.    Marland Kitchen omeprazole (PRILOSEC) 20 MG capsule Take 20 mg by mouth daily.    . simvastatin (ZOCOR) 20 MG tablet Take 40 mg by mouth at bedtime.      No current facility-administered medications on file prior to visit.         Objective:   Physical Exam Blood pressure (!) 180/80, pulse 72, temperature 98 F (36.7 C), height 5\' 4"  (1.626 m), weight 177 lb 14.4 oz (80.7 kg). Alert and oriented. Skin warm and dry. Oral mucosa is moist.   . Sclera anicteric, conjunctivae is  pink. Thyroid not enlarged. No cervical lymphadenopathy. Lungs clear. Heart regular rate and rhythm.  Abdomen is soft. Bowel sounds are positive. No hepatomegaly. No abdominal masses felt. No tenderness.  No edema to lower extremities.           Assessment & Plan:  GERD. He is doing well. She will continue the Prilosec. OV in 1 year.

## 2017-11-30 ENCOUNTER — Ambulatory Visit (INDEPENDENT_AMBULATORY_CARE_PROVIDER_SITE_OTHER): Payer: Medicare Other | Admitting: Internal Medicine

## 2017-12-12 ENCOUNTER — Ambulatory Visit (INDEPENDENT_AMBULATORY_CARE_PROVIDER_SITE_OTHER): Payer: Medicare Other | Admitting: Internal Medicine

## 2017-12-13 ENCOUNTER — Encounter (INDEPENDENT_AMBULATORY_CARE_PROVIDER_SITE_OTHER): Payer: Self-pay | Admitting: Internal Medicine

## 2017-12-13 ENCOUNTER — Ambulatory Visit (INDEPENDENT_AMBULATORY_CARE_PROVIDER_SITE_OTHER): Payer: Medicare Other | Admitting: Internal Medicine

## 2017-12-13 VITALS — BP 130/84 | HR 56 | Temp 98.1°F | Ht 64.0 in | Wt 178.1 lb

## 2017-12-13 DIAGNOSIS — K219 Gastro-esophageal reflux disease without esophagitis: Secondary | ICD-10-CM

## 2017-12-13 MED ORDER — OMEPRAZOLE 20 MG PO CPDR: 20.0000 mg | DELAYED_RELEASE_CAPSULE | Freq: Every day | ORAL | 3 refills | Status: DC

## 2017-12-13 NOTE — Patient Instructions (Signed)
Continue the Prilosec.

## 2017-12-13 NOTE — Progress Notes (Signed)
Subjective:    Patient ID: Allison [name], female    DOB: Mar 22, 1946, 72 y.o.   MRN: [id number]  HPI Here today for f/u. Last seen in July of 2018. Hx of chronic GERD and IBS.  She tells me she is doing well. She says she was recently diagnosed with diabetes. Her appetite is has remained good. She has lost about 24 pounds over the last 2 yrs which was intentional.  She has a BM x 1 daily. No melena or BRRB.     Prior GI studies; EGD on 08/20/2010. Small sliding hiatal hernia and few 2-5 mm hyperplasticpolyps. Nonerosive antral gastritis.  H. pylori serology was negative on 08/19/2010.  Colonoscopy on 04/12/2012. Data diverticula and sigmoid and transverse colon. External hemorrhoids and anal papillae.   Review of Systems Past Medical History:  Diagnosis Date  . Anxiety   . Arthritis    degenerative joint disease  . Diverticulosis   . Gastroesophageal reflux   . H/O hiatal hernia   . Heart murmur   . Hemorrhoid    hx. of  . Hypercholesteremia   . Hypertension   . Irritable bowel syndrome   . UTI symptoms    6 weeks ago-tx. Cipro-asymptomatic now    Past Surgical History:  Procedure Laterality Date  . ABDOMINAL HYSTERECTOMY     '91 uterus and 1 ovary, '08- other ovary removed.  Marland Kitchen BREAST LUMPECTOMY Left    more axilla area benign  . CESAREAN SECTION     '73, '76   . CHOLECYSTECTOMY     '08- laparoscopic  . COLONOSCOPY  08/11/2001  . COLONOSCOPY  04/12/2012   Procedure: COLONOSCOPY;  Surgeon: Rogene Houston, MD;  Location: [facility];  Service: Endoscopy;  Laterality: N/A;  1030  . ESOPHAGOGASTRODUODENOSCOPY  08/19/2010  . ESOPHAGOGASTRODUODENOSCOPY  09/24/2008  . TOTAL KNEE ARTHROPLASTY Right 09/20/2013   Procedure: RIGHT TOTAL KNEE REPLACEMENT;  Surgeon: Johnn Hai, MD;  Location: [facility];  Service: Orthopedics;  Laterality: Right;    Allergies  Allergen Reactions  . Codeine     REACTION: Unknown reaction  . Demerol [Meperidine] Nausea  And Vomiting  . Sulfonamide Derivatives     REACTION: Unknown reaction    Current Outpatient Medications on File Prior to Visit  Medication Sig Dispense Refill  . aspirin EC 81 MG tablet Take 81 mg by mouth daily.    . bisoprolol-hydrochlorothiazide (ZIAC) 10-6.25 MG per tablet Take 1 tablet by mouth every morning.     Javier Docker Oil 350 MG CAPS Take by mouth.    . losartan (COZAAR) 25 MG tablet Take 25 mg by mouth daily.    . multivitamin-iron-minerals-folic acid (CENTRUM) chewable tablet Chew 1 tablet by mouth daily.    Marland Kitchen omeprazole (PRILOSEC) 20 MG capsule Take 20 mg by mouth daily.    Marland Kitchen omeprazole (PRILOSEC) 20 MG capsule TAKE ONE (1) CAPSULE EACH DAY 90 capsule 5  . simvastatin (ZOCOR) 20 MG tablet Take 40 mg by mouth at bedtime.      No current facility-administered medications on file prior to visit.         Objective:   Physical Exam Blood pressure 130/84, pulse (!) 56, temperature 98.1 F (36.7 C), height 5\' 4"  (1.626 m), weight 178 lb 1.6 oz (Alert and oriented. Skin warm and dry. Oral mucosa is moist.   . Sclera anicteric, conjunctivae is pink. Thyroid not enlarged. No cervical lymphadenopathy. Lungs clear. Heart regular rate and rhythm.  Abdomen is soft.  Bowel sounds are positive. No hepatomegaly. No abdominal masses felt. No tenderness.  No edema to lower extremities. Patient is alert and   80.8 kg).        Assessment & Plan:  GERD. Continue the Prilosec. OV in 1 year.

## 2018-12-18 ENCOUNTER — Ambulatory Visit (INDEPENDENT_AMBULATORY_CARE_PROVIDER_SITE_OTHER): Payer: Medicare Other | Admitting: Nurse Practitioner

## 2019-01-27 NOTE — Progress Notes (Signed)
Subjective:    Patient ID: Allison [name], female    DOB: 1945/09/27, 73 y.o.   MRN: [id number]  HPI Allison [name] is a 73 year old female with a past medical history of anxiety, hypertension, heart murmur, GERD, IBS and diverticulosis.  She is taking omeprazole 20 mg once daily.  She denies having any dysphasia, heartburn or stomach pain.  No lower abdominal pain.  She is passing a normal formed bowel movement once daily.  No rectal bleeding or melena.  She takes aspirin daily, no other NSAIDs.  No weight loss.  She is slightly anxious today as she had recent laboratory studies that showed an elevated calcium level.  She is scheduled for repeat calcium blood test later today.  See EGD and colonoscopy reports below.  No family history of colorectal cancer.  EGD 08/20/10:  nonerosive gastritis and a few small hyperplastic gastric polyps and a small hiatal hernia.  Colonoscopy 04/12/2012:  Prep excellent. Scattered diverticula at sigmoid and transverse colon. No evidence of colonic polyps or other mucosal abnormalities. Normal rectal mucosa. Hemorrhoids below the dentate line along with anal papillae. 10 yr recall.   Past Medical History:  Diagnosis Date  . Anxiety   . Arthritis    degenerative joint disease  . Diverticulosis   . Gastroesophageal reflux   . H/O hiatal hernia   . Heart murmur   . Hemorrhoid    hx. of  . Hypercholesteremia   . Hypertension   . Irritable bowel syndrome   . UTI symptoms    6 weeks ago-tx. Cipro-asymptomatic now   Past Surgical History:  Procedure Laterality Date  . ABDOMINAL HYSTERECTOMY     '91 uterus and 1 ovary, '08- other ovary removed.  Marland Kitchen BREAST LUMPECTOMY Left    more axilla area benign  . CESAREAN SECTION     '73, '76   . CHOLECYSTECTOMY     '08- laparoscopic  . COLONOSCOPY  08/11/2001  . COLONOSCOPY  04/12/2012   Procedure: COLONOSCOPY;  Surgeon: Rogene Houston, MD;  Location: [facility];  Service: Endoscopy;  Laterality:  N/A;  1030  . ESOPHAGOGASTRODUODENOSCOPY  08/19/2010  . ESOPHAGOGASTRODUODENOSCOPY  09/24/2008  . TOTAL KNEE ARTHROPLASTY Right 09/20/2013   Procedure: RIGHT TOTAL KNEE REPLACEMENT;  Surgeon: Johnn Hai, MD;  Location: [facility];  Service: Orthopedics;  Laterality: Right;    Review of SystemsSee HPI all other systems reviewed and are negative     Objective:   Physical Exam  BP (!) 160/84   Pulse 60   Temp 98.6 F (37 C) (Oral)   Ht 5\' 4"  (1.626 m)   Wt 173 lb 4.8 oz (78.6 kg)   BMI 29.75 kg/m  General: 73 year old female appears slightly anxious in no acute distress Eyes: Sclera clear, injected have a pink Mouth: Dentition intact, no ulcers or lesions Neck: Supple, no lymphadenopathy or thyromegaly Heart: Regular rate and rhythm, no murmurs Lungs: Breath sounds clear throughout Abdomen: Soft, nontender, no masses or organomegaly, positive bowel sounds to all 4 quadrants Extremities: No edema Neuro: Alert and oriented x4, no focal deficits     Assessment & Plan:   1. GERD -Continue omeprazole 20 mg once daily  2.  Elevated calcium level on recent laboratory studies reported by the patient, repeat calcium levels to be done today -If calcium levels significantly elevated, to consider earlier colonoscopy and possible EGD -Request copy of laboratory studies from PCP  3. Colon cancer screening -next colonoscopy due 04/2022, earlier  if calcium levels elevated  -request copy of most recent CBC and CMP from PCP's office  4.  Hypertension -Patient to follow-up with her PCP regarding elevated blood pressure in office today

## 2019-01-29 ENCOUNTER — Ambulatory Visit (INDEPENDENT_AMBULATORY_CARE_PROVIDER_SITE_OTHER): Payer: Medicare Other | Admitting: Nurse Practitioner

## 2019-01-29 ENCOUNTER — Other Ambulatory Visit: Payer: Self-pay

## 2019-01-29 ENCOUNTER — Other Ambulatory Visit (INDEPENDENT_AMBULATORY_CARE_PROVIDER_SITE_OTHER): Payer: Self-pay | Admitting: *Deleted

## 2019-01-29 ENCOUNTER — Encounter (INDEPENDENT_AMBULATORY_CARE_PROVIDER_SITE_OTHER): Payer: Self-pay | Admitting: Nurse Practitioner

## 2019-01-29 DIAGNOSIS — K219 Gastro-esophageal reflux disease without esophagitis: Secondary | ICD-10-CM

## 2019-01-29 MED ORDER — OMEPRAZOLE 20 MG PO CPDR: DELAYED_RELEASE_CAPSULE | ORAL | 3 refills | Status: DC

## 2019-01-29 NOTE — Patient Instructions (Addendum)
1. Continue Omeperazole 20mg  one capsule by mouth ounce daiy  2. Next colonoscopy is due 04/2022, however, if your repeat calcium level is elevated an earlier colonoscopy would be recommended   3. Follow up in office in 1 year and as needed   4. Follow up with your primary care physician regarding elevated blood pressure

## 2019-06-15 DIAGNOSIS — H2511 Age-related nuclear cataract, right eye: Secondary | ICD-10-CM | POA: Diagnosis not present

## 2019-06-15 DIAGNOSIS — H2512 Age-related nuclear cataract, left eye: Secondary | ICD-10-CM | POA: Diagnosis not present

## 2019-07-13 DIAGNOSIS — H2512 Age-related nuclear cataract, left eye: Secondary | ICD-10-CM | POA: Diagnosis not present

## 2019-08-22 DIAGNOSIS — H524 Presbyopia: Secondary | ICD-10-CM | POA: Diagnosis not present

## 2019-08-22 DIAGNOSIS — Z961 Presence of intraocular lens: Secondary | ICD-10-CM | POA: Diagnosis not present

## 2019-08-22 DIAGNOSIS — H52223 Regular astigmatism, bilateral: Secondary | ICD-10-CM | POA: Diagnosis not present

## 2019-08-24 DIAGNOSIS — E6609 Other obesity due to excess calories: Secondary | ICD-10-CM | POA: Diagnosis not present

## 2019-08-24 DIAGNOSIS — M1991 Primary osteoarthritis, unspecified site: Secondary | ICD-10-CM | POA: Diagnosis not present

## 2019-08-24 DIAGNOSIS — Z6830 Body mass index (BMI) 30.0-30.9, adult: Secondary | ICD-10-CM | POA: Diagnosis not present

## 2019-08-24 DIAGNOSIS — Z0001 Encounter for general adult medical examination with abnormal findings: Secondary | ICD-10-CM | POA: Diagnosis not present

## 2019-08-24 DIAGNOSIS — Z1389 Encounter for screening for other disorder: Secondary | ICD-10-CM | POA: Diagnosis not present

## 2019-08-24 DIAGNOSIS — F419 Anxiety disorder, unspecified: Secondary | ICD-10-CM | POA: Diagnosis not present

## 2019-08-24 DIAGNOSIS — Z0000 Encounter for general adult medical examination without abnormal findings: Secondary | ICD-10-CM | POA: Diagnosis not present

## 2019-08-24 DIAGNOSIS — E119 Type 2 diabetes mellitus without complications: Secondary | ICD-10-CM | POA: Diagnosis not present

## 2019-08-24 DIAGNOSIS — I10 Essential (primary) hypertension: Secondary | ICD-10-CM | POA: Diagnosis not present

## 2019-08-29 DIAGNOSIS — Z6830 Body mass index (BMI) 30.0-30.9, adult: Secondary | ICD-10-CM | POA: Diagnosis not present

## 2019-08-29 DIAGNOSIS — N958 Other specified menopausal and perimenopausal disorders: Secondary | ICD-10-CM | POA: Diagnosis not present

## 2019-08-29 DIAGNOSIS — Z1231 Encounter for screening mammogram for malignant neoplasm of breast: Secondary | ICD-10-CM | POA: Diagnosis not present

## 2019-08-29 DIAGNOSIS — Z01419 Encounter for gynecological examination (general) (routine) without abnormal findings: Secondary | ICD-10-CM | POA: Diagnosis not present

## 2019-10-18 DIAGNOSIS — M1711 Unilateral primary osteoarthritis, right knee: Secondary | ICD-10-CM | POA: Diagnosis not present

## 2019-10-18 DIAGNOSIS — M170 Bilateral primary osteoarthritis of knee: Secondary | ICD-10-CM | POA: Diagnosis not present

## 2019-10-18 DIAGNOSIS — M1712 Unilateral primary osteoarthritis, left knee: Secondary | ICD-10-CM | POA: Diagnosis not present

## 2019-10-18 DIAGNOSIS — M25562 Pain in left knee: Secondary | ICD-10-CM | POA: Diagnosis not present

## 2019-11-19 DIAGNOSIS — M1712 Unilateral primary osteoarthritis, left knee: Secondary | ICD-10-CM | POA: Diagnosis not present

## 2019-11-19 DIAGNOSIS — Z96651 Presence of right artificial knee joint: Secondary | ICD-10-CM | POA: Diagnosis not present

## 2019-11-19 DIAGNOSIS — M25562 Pain in left knee: Secondary | ICD-10-CM | POA: Diagnosis not present

## 2019-11-26 DIAGNOSIS — M1712 Unilateral primary osteoarthritis, left knee: Secondary | ICD-10-CM | POA: Diagnosis not present

## 2019-12-03 DIAGNOSIS — M1712 Unilateral primary osteoarthritis, left knee: Secondary | ICD-10-CM | POA: Diagnosis not present

## 2020-01-29 ENCOUNTER — Ambulatory Visit (INDEPENDENT_AMBULATORY_CARE_PROVIDER_SITE_OTHER): Payer: Medicare Other | Admitting: Internal Medicine

## 2020-02-21 ENCOUNTER — Ambulatory Visit (INDEPENDENT_AMBULATORY_CARE_PROVIDER_SITE_OTHER): Payer: Medicare PPO | Admitting: Gastroenterology

## 2020-02-21 ENCOUNTER — Encounter (INDEPENDENT_AMBULATORY_CARE_PROVIDER_SITE_OTHER): Payer: Self-pay | Admitting: Gastroenterology

## 2020-02-21 ENCOUNTER — Other Ambulatory Visit: Payer: Self-pay

## 2020-02-21 VITALS — BP 167/87 | HR 69 | Temp 97.0°F | Ht 64.0 in | Wt 180.3 lb

## 2020-02-21 DIAGNOSIS — K219 Gastro-esophageal reflux disease without esophagitis: Secondary | ICD-10-CM

## 2020-02-21 MED ORDER — OMEPRAZOLE 20 MG PO CPDR
DELAYED_RELEASE_CAPSULE | ORAL | 3 refills | Status: DC
Start: 2020-02-21 — End: 2021-02-10

## 2020-02-21 NOTE — Progress Notes (Signed)
Patient profile: Allison [name] is a 74 y.o. female seen for yearly follow-up of GERD.  History of Present Illness: Allison [name] is seen today for follow-up.  She reports doing well over the past year.  She is maintained on omeprazole 20 mg once daily.  She did try decreasing to every other day but got breakthrough reflux.  She denies any nausea or vomiting.  History of dysphagia requiring dilation many years ago but currently no dysphagia symptoms.  She has gained weight recently-she is hoping to lose some of the weight she has gained  She is typically having bowel movement daily. Denies any constipation, melena, rectal bleeding.  Very rare diarrhea if gets nervous but this resolves without Imodium, etc.  Wt Readings from Last 3 Encounters:  02/21/20 180 lb 4.8 oz (81.8 kg)  01/29/19 173 lb 4.8 oz (78.6 kg)  12/13/17 178 lb 1.6 oz (80.8 kg)    EGD 08/20/10:  nonerosive gastritis and a few small hyperplastic gastric polyps and a small hiatal hernia.  Colonoscopy 04/12/2012:  Prep excellent. Scattered diverticula at sigmoid and transverse colon. No evidence of colonic polyps or other mucosal abnormalities. Normal rectal mucosa. Hemorrhoids below the dentate line along with anal papillae. 10 yr recall.   Past Medical History:  Past Medical History:  Diagnosis Date  . Anxiety   . Arthritis    degenerative joint disease  . Diverticulosis   . Gastroesophageal reflux   . H/O hiatal hernia   . Heart murmur   . Hemorrhoid    hx. of  . Hypercholesteremia   . Hypertension   . Irritable bowel syndrome   . UTI symptoms    6 weeks ago-tx. Cipro-asymptomatic now    Problem List: Patient Active Problem List   Diagnosis Date Noted  . GERD (gastroesophageal reflux disease) 01/29/2019  . Anxiety state, unspecified 10/11/2013  . Anemia 10/11/2013  . Right knee DJD 09/20/2013  . IBS (irritable bowel syndrome) 10/30/2012  . DYSPHAGIA 09/18/2008  . HYPERCHOLESTEROLEMIA  09/17/2008  . MIGRAINE HEADACHE 09/17/2008  . HYPERTENSION 09/17/2008  . HEMORRHOIDS, EXTERNAL 09/17/2008  . HEMOCCULT POSITIVE STOOL 09/17/2008  . ARTHRITIS 09/17/2008  . DEPRESSION, HX OF 09/17/2008  . HEARTBURN, HX OF 09/17/2008    Past Surgical History: Past Surgical History:  Procedure Laterality Date  . ABDOMINAL HYSTERECTOMY     '91 uterus and 1 ovary, '08- other ovary removed.  Marland Kitchen BREAST LUMPECTOMY Left    more axilla area benign  . CESAREAN SECTION     '73, '76   . CHOLECYSTECTOMY     '08- laparoscopic  . COLONOSCOPY  08/11/2001  . COLONOSCOPY  04/12/2012   Procedure: COLONOSCOPY;  Surgeon: Rogene Houston, MD;  [facility]: [facility];  Service: Endoscopy;  Laterality: N/A;  1030  . ESOPHAGOGASTRODUODENOSCOPY  08/19/2010  . ESOPHAGOGASTRODUODENOSCOPY  09/24/2008  . TOTAL KNEE ARTHROPLASTY Right 09/20/2013   Procedure: RIGHT TOTAL KNEE REPLACEMENT;  Surgeon: Johnn Hai, MD;  Location: [facility];  Service: Orthopedics;  Laterality: Right;    Allergies: Allergies  Allergen Reactions  . Codeine     REACTION: Unknown reaction  . Demerol [Meperidine] Nausea And Vomiting  . Fentanyl Nausea And Vomiting  . Sulfa Antibiotics   . Sulfonamide Derivatives     REACTION: Unknown reaction      Home Medications:  Current Outpatient Medications:  .  aspirin EC 81 MG tablet, Take 81 mg by mouth daily., Disp: , Rfl:  .  bisoprolol-hydrochlorothiazide (ZIAC) 10-6.25 MG  per tablet, Take 1 tablet by mouth every morning. , Disp: , Rfl:  .  Coenzyme Q10 (CO Q 10) 100 MG CAPS, Take 200 mg by mouth daily., Disp: , Rfl:  .  Krill Oil 350 MG CAPS, Take by mouth., Disp: , Rfl:  .  losartan (COZAAR) 100 MG tablet, Take 100 mg by mouth daily. , Disp: , Rfl:  .  multivitamin-iron-minerals-folic acid (CENTRUM) chewable tablet, Chew 1 tablet by mouth daily., Disp: , Rfl:  .  omeprazole (PRILOSEC) 20 MG capsule, TAKE ONE (1) CAPSULE EACH DAY, Disp: 90 capsule, Rfl: 3 .  simvastatin (ZOCOR)  20 MG tablet, Take 40 mg by mouth at bedtime. , Disp: , Rfl:    Family History: family history includes COPD in her mother; Diabetes in her brother; Diverticulitis in her brother; Healthy in her sister, son, and son; Irregular heart beat in her father.  Denies any family history of colon polyps colon cancer  Social History:   reports that she has never smoked. She has never used smokeless tobacco. She reports that she does not drink alcohol and does not use drugs.   Review of Systems: Constitutional: + weight gain   Eyes: No changes in vision. ENT: No oral lesions, sore throat.  GI: see HPI.  Heme/Lymph: No easy bruising.  CV: No chest pain.  GU: No hematuria.  Integumentary: No rashes.  Neuro: No headaches.  Psych: No depression/anxiety.  Endocrine: No heat/cold intolerance.  Allergic/Immunologic: No urticaria.  Resp: No cough, SOB.  Musculoskeletal: No joint swelling.    Physical Examination: BP (!) 167/87 (BP Location: Right Arm, Patient Position: Sitting, Cuff Size: Normal)   Pulse 69   Temp (!) 97 F (36.1 C) (Temporal)   Ht 5\' 4"  (1.626 m)   Wt 180 lb 4.8 oz (81.8 kg)   BMI 30.95 kg/m  Gen: NAD, alert and oriented x 4 HEENT: PEERLA, EOMI, Neck: supple, no JVD Chest: CTA bilaterally, no wheezes, crackles, or other adventitious sounds CV: RRR, no m/g/c/r Abd: soft, NT, ND, +BS in all four quadrants; no HSM, guarding, ridigity, or rebound tenderness Ext: no edema, well perfused with 2+ pulses, Skin: no rash or lesions noted on observed skin Lymph: no noted LAD  Data Reviewed:  02/2019-labs reviewed from [place]. Has yearly labs planned this month.    Assessment/Plan: Allison [name] is a 74 y.o. female seen for follow-up of GERD  1.  GERD-symptomatic with every other day PPI.  We will continue daily omeprazole 20 mg.  She is compliant with diet modifications.  She rarely takes NSAIDs.  She has no upper GI alarm symptoms, will notify me if develops   2.  Colon  cancer screening-due for repeat 2023  F/up 1 year - call sooner if needed.   Allison [name] was seen today for gastroesophageal reflux.  Diagnoses and all orders for this visit:  Chronic GERD  Gastroesophageal reflux disease without esophagitis -     omeprazole (PRILOSEC) 20 MG capsule; TAKE ONE (1) CAPSULE EACH DAY     I personally performed the service, non-incident to. (WP)  Laurine Blazer, [facility]

## 2020-02-21 NOTE — Patient Instructions (Addendum)
GERD instructions: -Please avoid lying flat within 2 to 3 hours of eating, this will make reflux symptoms worse. -Some patients find elevating the head of the bed beneficial. -Avoid spicy greasy foods as well as caffeine, coffee, sodas-these food/drinks can worsen heartburn and reflux. -Tobacco will worsen reflux, please try to decrease/eliminate tobacco intake if applicable. -Avoid NSAID products (ibuprofen, aspirin, Advil, Aleve, Goody's, BCs, Alka-Seltzer) - if needing these occasionally please try to take with meal or snack to decrease stomach irritation. -If taking medication for reflux such as prilosec, nexium, aciphex, dexilant, prevacid - take 20-30 minutes before a meal for maximum effectiveness.    Omeprazole 20mg  refilled

## 2020-02-26 DIAGNOSIS — E119 Type 2 diabetes mellitus without complications: Secondary | ICD-10-CM | POA: Diagnosis not present

## 2020-03-28 DIAGNOSIS — K219 Gastro-esophageal reflux disease without esophagitis: Secondary | ICD-10-CM | POA: Diagnosis not present

## 2020-03-28 DIAGNOSIS — E119 Type 2 diabetes mellitus without complications: Secondary | ICD-10-CM | POA: Diagnosis not present

## 2020-03-28 DIAGNOSIS — E6609 Other obesity due to excess calories: Secondary | ICD-10-CM | POA: Diagnosis not present

## 2020-03-28 DIAGNOSIS — I10 Essential (primary) hypertension: Secondary | ICD-10-CM | POA: Diagnosis not present

## 2020-03-28 DIAGNOSIS — Z6831 Body mass index (BMI) 31.0-31.9, adult: Secondary | ICD-10-CM | POA: Diagnosis not present

## 2020-08-24 DIAGNOSIS — H43812 Vitreous degeneration, left eye: Secondary | ICD-10-CM | POA: Diagnosis not present

## 2020-09-16 DIAGNOSIS — H18413 Arcus senilis, bilateral: Secondary | ICD-10-CM | POA: Diagnosis not present

## 2020-09-16 DIAGNOSIS — Z961 Presence of intraocular lens: Secondary | ICD-10-CM | POA: Diagnosis not present

## 2020-09-16 DIAGNOSIS — H18593 Other hereditary corneal dystrophies, bilateral: Secondary | ICD-10-CM | POA: Diagnosis not present

## 2020-09-16 DIAGNOSIS — H43812 Vitreous degeneration, left eye: Secondary | ICD-10-CM | POA: Diagnosis not present

## 2020-10-28 DIAGNOSIS — Z6832 Body mass index (BMI) 32.0-32.9, adult: Secondary | ICD-10-CM | POA: Diagnosis not present

## 2020-10-28 DIAGNOSIS — Z1231 Encounter for screening mammogram for malignant neoplasm of breast: Secondary | ICD-10-CM | POA: Diagnosis not present

## 2020-10-28 DIAGNOSIS — Z01419 Encounter for gynecological examination (general) (routine) without abnormal findings: Secondary | ICD-10-CM | POA: Diagnosis not present

## 2020-10-28 DIAGNOSIS — N390 Urinary tract infection, site not specified: Secondary | ICD-10-CM | POA: Diagnosis not present

## 2020-11-03 DIAGNOSIS — M810 Age-related osteoporosis without current pathological fracture: Secondary | ICD-10-CM | POA: Diagnosis not present

## 2020-11-03 DIAGNOSIS — K219 Gastro-esophageal reflux disease without esophagitis: Secondary | ICD-10-CM | POA: Diagnosis not present

## 2020-11-03 DIAGNOSIS — M1991 Primary osteoarthritis, unspecified site: Secondary | ICD-10-CM | POA: Diagnosis not present

## 2020-11-03 DIAGNOSIS — Z0001 Encounter for general adult medical examination with abnormal findings: Secondary | ICD-10-CM | POA: Diagnosis not present

## 2020-11-03 DIAGNOSIS — Z6832 Body mass index (BMI) 32.0-32.9, adult: Secondary | ICD-10-CM | POA: Diagnosis not present

## 2020-11-03 DIAGNOSIS — E559 Vitamin D deficiency, unspecified: Secondary | ICD-10-CM | POA: Diagnosis not present

## 2020-11-03 DIAGNOSIS — Z1389 Encounter for screening for other disorder: Secondary | ICD-10-CM | POA: Diagnosis not present

## 2020-11-03 DIAGNOSIS — E6609 Other obesity due to excess calories: Secondary | ICD-10-CM | POA: Diagnosis not present

## 2020-11-03 DIAGNOSIS — E782 Mixed hyperlipidemia: Secondary | ICD-10-CM | POA: Diagnosis not present

## 2020-11-03 DIAGNOSIS — E538 Deficiency of other specified B group vitamins: Secondary | ICD-10-CM | POA: Diagnosis not present

## 2020-11-03 DIAGNOSIS — I10 Essential (primary) hypertension: Secondary | ICD-10-CM | POA: Diagnosis not present

## 2020-11-03 DIAGNOSIS — E1165 Type 2 diabetes mellitus with hyperglycemia: Secondary | ICD-10-CM | POA: Diagnosis not present

## 2020-12-27 ENCOUNTER — Other Ambulatory Visit: Payer: Self-pay

## 2020-12-27 ENCOUNTER — Emergency Department (HOSPITAL_COMMUNITY): Payer: Medicare PPO

## 2020-12-27 ENCOUNTER — Emergency Department (HOSPITAL_COMMUNITY)
Admission: EM | Admit: 2020-12-27 | Discharge: 2020-12-27 | Disposition: A | Discharge status: Home / Self Care | Payer: Medicare PPO | Attending: Student | Admitting: Student

## 2020-12-27 ENCOUNTER — Encounter (HOSPITAL_COMMUNITY): Payer: Self-pay | Admitting: *Deleted

## 2020-12-27 DIAGNOSIS — Z7982 Long term (current) use of aspirin: Secondary | ICD-10-CM | POA: Diagnosis not present

## 2020-12-27 DIAGNOSIS — M546 Pain in thoracic spine: Secondary | ICD-10-CM | POA: Diagnosis not present

## 2020-12-27 DIAGNOSIS — K449 Diaphragmatic hernia without obstruction or gangrene: Secondary | ICD-10-CM | POA: Diagnosis not present

## 2020-12-27 DIAGNOSIS — R079 Chest pain, unspecified: Secondary | ICD-10-CM | POA: Diagnosis not present

## 2020-12-27 DIAGNOSIS — I700 Atherosclerosis of aorta: Secondary | ICD-10-CM | POA: Diagnosis not present

## 2020-12-27 DIAGNOSIS — Z96651 Presence of right artificial knee joint: Secondary | ICD-10-CM | POA: Diagnosis not present

## 2020-12-27 DIAGNOSIS — I10 Essential (primary) hypertension: Secondary | ICD-10-CM | POA: Diagnosis not present

## 2020-12-27 DIAGNOSIS — Z79899 Other long term (current) drug therapy: Secondary | ICD-10-CM | POA: Diagnosis not present

## 2020-12-27 DIAGNOSIS — M47814 Spondylosis without myelopathy or radiculopathy, thoracic region: Secondary | ICD-10-CM | POA: Diagnosis not present

## 2020-12-27 LAB — URINALYSIS, ROUTINE W REFLEX MICROSCOPIC
Bilirubin Urine: NEGATIVE
Glucose, UA: NEGATIVE mg/dL
Hgb urine dipstick: NEGATIVE
Ketones, ur: NEGATIVE mg/dL
Leukocytes,Ua: NEGATIVE
Nitrite: NEGATIVE
Protein, ur: NEGATIVE mg/dL
Specific Gravity, Urine: 1.019 (ref 1.005–1.030)
pH: 5 (ref 5.0–8.0)

## 2020-12-27 LAB — I-STAT CHEM 8, ED
BUN: 18 mg/dL (ref 8–23)
Calcium, Ion: 1.12 mmol/L — ABNORMAL LOW (ref 1.15–1.40)
Chloride: 108 mmol/L (ref 98–111)
Creatinine, Ser: 0.7 mg/dL (ref 0.44–1.00)
Glucose, Bld: 109 mg/dL — ABNORMAL HIGH (ref 70–99)
HCT: 40 % (ref 36.0–46.0)
Hemoglobin: 13.6 g/dL (ref 12.0–15.0)
Potassium: 4.1 mmol/L (ref 3.5–5.1)
Sodium: 141 mmol/L (ref 135–145)
TCO2: 23 mmol/L (ref 22–32)

## 2020-12-27 MED ORDER — IOHEXOL 350 MG/ML SOLN
100.0000 mL | Freq: Once | INTRAVENOUS | Status: AC | PRN
  Administered 2020-12-27: 100 mL via INTRAVENOUS

## 2020-12-27 NOTE — ED Notes (Signed)
Discussed BP 182/79 with PA. Per PA does not feel like pt requires more intervention. Pt verbalized feeling stressed about hospital visit and sister. PA recommend pt recheck BP at home when she is more relaxed. Discharge instructions discussed with pt.  Pt verbalized understanding with no other questions at this time. Pt ambulatory at discharge with no other complaints

## 2020-12-27 NOTE — ED Provider Notes (Signed)
[facility] Provider Note   CSN: [id number] Arrival date & time: 12/27/20  1150     History No chief complaint on file.   Allison [name] is a 75 y.o. female.  Pt complains of sudden onset of severe pain in right upper back.  Pt reports sharp stabbing with she turns or moves.    The history is provided by the patient. No language interpreter was used.  Back Pain Location:  Thoracic spine Quality:  Aching Radiates to:  Does not radiate Pain severity:  Moderate Pain is:  Same all the time Onset quality:  Gradual Duration:  1 day Timing:  Constant Progression:  Worsening Chronicity:  New Relieved by:  Nothing Worsened by:  Nothing Associated symptoms: no abdominal pain, no fever and no headaches   Risk factors: no hx of cancer and no recent surgery       Past Medical History:  Diagnosis Date   Anxiety    Arthritis    degenerative joint disease   Diverticulosis    Gastroesophageal reflux    H/O hiatal hernia    Heart murmur    Hemorrhoid    hx. of   Hypercholesteremia    Hypertension    Irritable bowel syndrome    UTI symptoms    6 weeks ago-tx. Cipro-asymptomatic now    Patient Active Problem List   Diagnosis Date Noted   GERD (gastroesophageal reflux disease) 01/29/2019   Anxiety state, unspecified 10/11/2013   Anemia 10/11/2013   Right knee DJD 09/20/2013   IBS (irritable bowel syndrome) 10/30/2012   DYSPHAGIA 09/18/2008   HYPERCHOLESTEROLEMIA 09/17/2008   MIGRAINE HEADACHE 09/17/2008   HYPERTENSION 09/17/2008   HEMORRHOIDS, EXTERNAL 09/17/2008   HEMOCCULT POSITIVE STOOL 09/17/2008   ARTHRITIS 09/17/2008   DEPRESSION, HX OF 09/17/2008   HEARTBURN, HX OF 09/17/2008    Past Surgical History:  Procedure Laterality Date   ABDOMINAL HYSTERECTOMY     '91 uterus and 1 ovary, '08- other ovary removed.   BREAST LUMPECTOMY Left    more axilla area benign   CESAREAN SECTION     '73, '76    CHOLECYSTECTOMY     '08- laparoscopic    COLONOSCOPY  08/11/2001   COLONOSCOPY  04/12/2012   Procedure: COLONOSCOPY;  Surgeon: Rogene Houston, MD;  [facility];  Service: Endoscopy;  Laterality: N/A;  1030   ESOPHAGOGASTRODUODENOSCOPY  08/19/2010   ESOPHAGOGASTRODUODENOSCOPY  09/24/2008   TOTAL KNEE ARTHROPLASTY Right 09/20/2013   Procedure: RIGHT TOTAL KNEE REPLACEMENT;  Surgeon: Johnn Hai, MD;  Location: [facility];  Service: Orthopedics;  Laterality: Right;     OB History   No obstetric history on file.     Family History  Problem Relation Age of Onset   COPD Mother    Irregular heart beat Father    Healthy Sister    Diverticulitis Brother    Diabetes Brother    Healthy Son    Healthy Son     Social History   Tobacco Use   Smoking status: Never   Smokeless tobacco: Never  Substance Use Topics   Alcohol use: No   Drug use: No    Home Medications Prior to Admission medications   Medication Sig Start Date End Date Taking? Authorizing Provider  aspirin EC 81 MG tablet Take 81 mg by mouth daily.    [provider]  bisoprolol-hydrochlorothiazide (ZIAC) 10-6.25 MG per tablet Take 1 tablet by mouth every morning.     [provider]  Coenzyme Q10 (CO Q 10) 100 MG CAPS Take 200 mg by mouth daily.    [provider]  Astrid Drafts 350 MG CAPS Take by mouth.    [provider]  losartan (COZAAR) 100 MG tablet Take 100 mg by mouth daily.     [provider]  multivitamin-iron-minerals-folic acid (CENTRUM) chewable tablet Chew 1 tablet by mouth daily.    [provider]  omeprazole (PRILOSEC) 20 MG capsule TAKE ONE (1) CAPSULE EACH DAY 02/21/20   Laurine Blazer B, PA-C  simvastatin (ZOCOR) 20 MG tablet Take 40 mg by mouth at bedtime.     [provider]    Allergies    Codeine, Demerol [meperidine], Fentanyl, Sulfa antibiotics, and Sulfonamide derivatives  Review of Systems   Review of Systems  Constitutional:  Negative for fever.   Gastrointestinal:  Negative for abdominal pain.  Musculoskeletal:  Positive for back pain.  Neurological:  Negative for headaches.  All other systems reviewed and are negative.  Physical Exam Updated Vital Signs BP (!) 182/79   Pulse 62   Temp 98.2 F (36.8 C) (Oral)   Resp 14   SpO2 99%   Physical Exam Vitals and nursing note reviewed.  Constitutional:      Appearance: She is well-developed.  HENT:     Head: Normocephalic.     Nose: Nose normal.  Cardiovascular:     Rate and Rhythm: Normal rate.  Pulmonary:     Effort: Pulmonary effort is normal.  Abdominal:     General: Abdomen is flat. There is no distension.  Musculoskeletal:        General: Normal range of motion.     Cervical back: Normal range of motion.  Skin:    General: Skin is warm.  Neurological:     General: No focal deficit present.     Mental Status: She is alert and oriented to person, place, and time.  Psychiatric:        Mood and Affect: Mood normal.    ED Results / Procedures / Treatments   Labs (all labs ordered are listed, but only abnormal results are displayed) Labs Reviewed  I-STAT CHEM 8, ED - Abnormal; Notable for the following components:      Result Value   Glucose, Bld 109 (*)    Calcium, Ion 1.12 (*)    All other components within normal limits  URINALYSIS, ROUTINE W REFLEX MICROSCOPIC    EKG None  Radiology DG Chest 2 View  Result Date: 12/27/2020 CLINICAL DATA:  Upper back pain EXAM: CHEST - 2 VIEW COMPARISON:  09/11/2013 FINDINGS: Heart and mediastinal contours are within normal limits. No focal opacities or effusions. No acute bony abnormality. Mild degenerative changes throughout the thoracic spine. No focal abnormality or acute bony abnormality. IMPRESSION: No active cardiopulmonary disease. Electronically Signed   By: Rolm Baptise M.D.   On: 12/27/2020 13:13   CT ANGIO CHEST AORTA W/CM & OR WO/CM  Result Date: 12/27/2020 CLINICAL DATA:  Chest pain and LOWER back pain.  Suspect aortic dissection. EXAM: CT ANGIOGRAPHY CHEST WITH CONTRAST TECHNIQUE: Multidetector CT imaging of the chest was performed using the standard protocol during bolus administration of intravenous contrast. Multiplanar CT image reconstructions and MIPs were obtained to evaluate the vascular anatomy. CONTRAST:  130m OMNIPAQUE IOHEXOL 350 MG/ML SOLN COMPARISON:  Chest x-ray 12/27/2020 and CT of the chest on 05/12/2005 FINDINGS: Cardiovascular: Heart size is normal. No pericardial effusion. No significant coronary artery calcification. There is  atherosclerotic calcification of the thoracic aorta not associated with aneurysm or dissection. The appearance of the pulmonary arteries is within normal limits accounting for contrast bolus timing. Mediastinum/Nodes: Small hiatal hernia. The visualized portion of the thyroid gland has a normal appearance. No significant mediastinal, hilar, or axillary adenopathy. Lungs/Pleura: Lungs are clear. No pleural effusion or pneumothorax. Upper Abdomen: Hepatic steatosis.  No acute abnormality. Musculoskeletal: Mild midthoracic degenerative changes. No lytic or blastic lesions. Review of the MIP images confirms the above findings. IMPRESSION: 1. No aortic dissection or aneurysm. 2. Aortic atherosclerosis.  (ICD10-I70.0) 3. Small hiatal hernia. 4. Hepatic steatosis. Electronically Signed   By: Nolon Nations M.D.   On: 12/27/2020 16:23    Procedures Procedures   Medications Ordered in ED Medications  iohexol (OMNIPAQUE) 350 MG/ML injection 100 mL (100 mLs Intravenous Contrast Given 12/27/20 1553)    ED Course  I have reviewed the triage vital signs and the nursing notes.  Pertinent labs & imaging results that were available during my care of the patient were reviewed by me and considered in my medical decision making (see chart for details).    MDM Rules/Calculators/A&P                           MDM: chest xray normal  EKg no acute abnormality  ua is negative.   Ct angio obtained no acute abnormality    Pt advised tylenol  Follow up with Dr. Gerarda Fraction this week for rehceck Final Clinical Impression(s) / ED Diagnoses Final diagnoses:  Acute right-sided thoracic back pain    Rx / DC Orders ED Discharge Orders     None     An After Visit Summary was printed and given to the patient.    Sidney Ace 12/27/20 Orrin Brigham, MD 12/28/20 [phone]

## 2020-12-27 NOTE — ED Notes (Signed)
Patient transported to CT 

## 2020-12-27 NOTE — ED Notes (Signed)
Pt to have istat chem 8 recollected d/t suspicion for sample clotting, PA aware of plan, phlebotomy at bedside now, pt updated

## 2020-12-27 NOTE — ED Triage Notes (Signed)
Pt reports mid R back pain that radiates to L mid back onset yesterday, denies injury, states, "it was better this morning and then I went to the grocery store and it started to hurt." Denies SOB, pt ambulatory, denies bowel and bladder incontinence, moves all extremities, A&O X4

## 2021-01-31 DIAGNOSIS — I10 Essential (primary) hypertension: Secondary | ICD-10-CM | POA: Diagnosis not present

## 2021-01-31 DIAGNOSIS — M810 Age-related osteoporosis without current pathological fracture: Secondary | ICD-10-CM | POA: Diagnosis not present

## 2021-01-31 DIAGNOSIS — M1991 Primary osteoarthritis, unspecified site: Secondary | ICD-10-CM | POA: Diagnosis not present

## 2021-01-31 DIAGNOSIS — K219 Gastro-esophageal reflux disease without esophagitis: Secondary | ICD-10-CM | POA: Diagnosis not present

## 2021-01-31 DIAGNOSIS — U071 COVID-19: Secondary | ICD-10-CM | POA: Diagnosis not present

## 2021-01-31 DIAGNOSIS — E6609 Other obesity due to excess calories: Secondary | ICD-10-CM | POA: Diagnosis not present

## 2021-02-09 ENCOUNTER — Other Ambulatory Visit (INDEPENDENT_AMBULATORY_CARE_PROVIDER_SITE_OTHER): Payer: Self-pay | Admitting: *Deleted

## 2021-02-10 ENCOUNTER — Other Ambulatory Visit (INDEPENDENT_AMBULATORY_CARE_PROVIDER_SITE_OTHER): Payer: Self-pay | Admitting: *Deleted

## 2021-02-10 DIAGNOSIS — K219 Gastro-esophageal reflux disease without esophagitis: Secondary | ICD-10-CM

## 2021-02-10 MED ORDER — OMEPRAZOLE 20 MG PO CPDR: DELAYED_RELEASE_CAPSULE | ORAL | 0 refills | Status: DC

## 2021-02-26 DIAGNOSIS — E119 Type 2 diabetes mellitus without complications: Secondary | ICD-10-CM | POA: Diagnosis not present

## 2021-03-31 ENCOUNTER — Ambulatory Visit (INDEPENDENT_AMBULATORY_CARE_PROVIDER_SITE_OTHER): Payer: Medicare PPO | Admitting: Gastroenterology

## 2021-04-30 ENCOUNTER — Other Ambulatory Visit: Payer: Self-pay

## 2021-04-30 ENCOUNTER — Encounter (INDEPENDENT_AMBULATORY_CARE_PROVIDER_SITE_OTHER): Payer: Self-pay | Admitting: Gastroenterology

## 2021-04-30 ENCOUNTER — Ambulatory Visit (INDEPENDENT_AMBULATORY_CARE_PROVIDER_SITE_OTHER): Payer: Medicare PPO | Admitting: Gastroenterology

## 2021-04-30 VITALS — BP 151/71 | HR 69 | Temp 98.8°F | Ht 64.0 in | Wt 184.7 lb

## 2021-04-30 DIAGNOSIS — K219 Gastro-esophageal reflux disease without esophagitis: Secondary | ICD-10-CM | POA: Diagnosis not present

## 2021-04-30 NOTE — Patient Instructions (Signed)
We will continue with omeprazole 20mg  once daily, I have sent a refill to your pharmacy. We will plan for colonscopy around dec 2023, as we discussed, you are at an average risk of CRC as you have no family history or personal history of colon polyps, and would be a good candidate for cologuard, however, we recommend colonoscopy for colorectal cancer screening as this is the most accurate method to evaluate for precancerous polyps and cancers. If cologuard is positive you will need a colonoscopy thereafter for further evaluation.  Please check your BP at home 2-3x/week, around the same time everyday, keep a log of your BPs and if you develop headache, blurred vision or dizziness, you need to make your PCP aware immediately. Continue to pursue watching your diet and exercising, avoiding, fried, fast, fatty foods and foods high in salt, increase whole grains, fruits and veggies.   We will plan to see you in 1 year, unless you need [place] sooner.

## 2021-04-30 NOTE — Progress Notes (Signed)
Referring Provider: Redmond School, MD Primary Care Physician:  Redmond School, MD Primary GI Physician: Rehman  Chief Complaint  Patient presents with   Gastroesophageal Reflux    Follow up on GERD. Pt states she is doing well on med. Takes omeprazole.    HPI:   Allison [name] is a 75 y.o. female with past medical history of anxiety, arthritis, diverticulosis, GERD, hiatal hernia, heart murmur, hemorrhoids, high cholesterol, HTN, IBS.   Patient presenting today for follow up of GERD, seen in clinic last in October 2021.  She is maintained on Omeprazole 20mg  once daily for her GERD. She states that she is doing well on her current regimen. She states that last night she ate late and then went to bed and had some acid reflux, however, she has had no other episodes of reflux other than that one. No red flag symptoms. Patient denies melena, hematochezia, nausea, vomiting, diarrhea, constipation, dysphagia, odyonophagia, early satiety or weight loss.   Last Colonoscopy:04/12/12 prep excellent, scatter diverticula at sigmoid and transverse colon, no evidence of colonic polyps or other mucosal abnormalities, normal rectal mucosa, hemorrhoids below dentate line along with anal papillae Last Endoscopy:08/20/10  Recommendations:  Colonoscopy dec 2023  Past Medical History:  Diagnosis Date   Anxiety    Arthritis    degenerative joint disease   Diverticulosis    Gastroesophageal reflux    H/O hiatal hernia    Heart murmur    Hemorrhoid    hx. of   Hypercholesteremia    Hypertension    Irritable bowel syndrome    UTI symptoms    6 weeks ago-tx. Cipro-asymptomatic now    Past Surgical History:  Procedure Laterality Date   ABDOMINAL HYSTERECTOMY     '91 uterus and 1 ovary, '08- other ovary removed.   BREAST LUMPECTOMY Left    more axilla area benign   CESAREAN SECTION     '73, '76    CHOLECYSTECTOMY     '08- laparoscopic   COLONOSCOPY  08/11/2001   COLONOSCOPY  04/12/2012    Procedure: COLONOSCOPY;  Surgeon: Rogene Houston, MD;  [facility]: [facility];  Service: Endoscopy;  Laterality: N/A;  1030   ESOPHAGOGASTRODUODENOSCOPY  08/19/2010   ESOPHAGOGASTRODUODENOSCOPY  09/24/2008   TOTAL KNEE ARTHROPLASTY Right 09/20/2013   Procedure: RIGHT TOTAL KNEE REPLACEMENT;  Surgeon: Johnn Hai, MD;  Location: [facility];  Service: Orthopedics;  Laterality: Right;    Current Outpatient Medications  Medication Sig Dispense Refill   aspirin EC 81 MG tablet Take 81 mg by mouth daily.     bisoprolol-hydrochlorothiazide (ZIAC) 10-6.25 MG per tablet Take 1 tablet by mouth every morning.      Coenzyme Q10 (CO Q 10) 100 MG CAPS Take 200 mg by mouth daily.     Krill Oil 350 MG CAPS Take by mouth.     losartan (COZAAR) 100 MG tablet Take 100 mg by mouth daily.      multivitamin-iron-minerals-folic acid (CENTRUM) chewable tablet Chew 1 tablet by mouth daily.     omeprazole (PRILOSEC) 20 MG capsule TAKE ONE (1) CAPSULE EACH DAY. NEEDS OFFICE VISIT 90 capsule 0   simvastatin (ZOCOR) 20 MG tablet Take 40 mg by mouth at bedtime.      No current facility-administered medications for this visit.    Allergies as of 04/30/2021 - Review Complete 04/30/2021  Allergen Reaction Noted   Codeine     Demerol [meperidine] Nausea And Vomiting 09/10/2013   Fentanyl Nausea And Vomiting 02/21/2020  Sulfa antibiotics  02/21/2020   Sulfonamide derivatives      Family History  Problem Relation Age of Onset   COPD Mother    Irregular heart beat Father    Healthy Sister    Diverticulitis Brother    Diabetes Brother    Healthy Son    Healthy Son     Social History   Socioeconomic History   Marital status: Widowed    Spouse name: Not on file   Number of children: Not on file   Years of education: Not on file   Highest education level: Not on file  Occupational History   Not on file  Tobacco Use   Smoking status: Never   Smokeless tobacco: Never  Substance and Sexual Activity    Alcohol use: No   Drug use: No   Sexual activity: Never  Other Topics Concern   Not on file  Social History Narrative   Not on file   Social Determinants of Health   Financial Resource Strain: Not on file  Food Insecurity: Not on file  Transportation Needs: Not on file  Physical Activity: Not on file  Stress: Not on file  Social Connections: Not on file   Review of systems General: negative for malaise, night sweats, fever, chills, weight loss Neck: Negative for lumps, goiter, pain and significant neck swelling Resp: Negative for cough, wheezing, dyspnea at rest CV: Negative for chest pain, leg swelling, palpitations, orthopnea GI: denies melena, hematochezia, nausea, vomiting, diarrhea, constipation, dysphagia, odyonophagia, early satiety or unintentional weight loss.  MSK: Negative for joint pain or swelling, back pain, and muscle pain. Derm: Negative for itching or rash Psych: Denies depression, anxiety, memory loss, confusion. No homicidal or suicidal ideation.  Heme: Negative for prolonged bleeding, bruising easily, and swollen nodes. Endocrine: Negative for cold or heat intolerance, polyuria, polydipsia and goiter. Neuro: negative for tremor, gait imbalance, syncope and seizures. The remainder of the review of systems is noncontributory.  Physical Exam: BP (!) 185/94 (BP Location: Right Arm, Patient Position: Sitting, Cuff Size: Large)    Pulse 69    Temp 98.8 F (37.1 C) (Oral)    Ht 5\' 4"  (1.626 m)    Wt 184 lb 11.2 oz (83.8 kg)    BMI 31.70 kg/m  General:   Alert and oriented. No distress noted. Pleasant and cooperative.  Head:  Normocephalic and atraumatic. Eyes:  Conjuctiva clear without scleral icterus. Mouth:  Oral mucosa pink and moist. Good dentition. No lesions. Heart: Normal rate and rhythm, s1 and s2 heart sounds present.  Lungs: Clear lung sounds in all lobes. Respirations equal and unlabored. Abdomen:  +BS, soft, non-tender and non-distended. No rebound or  guarding. No HSM or masses noted. Derm: No palmar erythema or jaundice Msk:  Symmetrical without gross deformities. Normal posture. Extremities:  Without edema. Neurologic:  Alert and  oriented x4 Psych:  Alert and cooperative. Normal mood and affect.  Invalid input(s): 6 MONTHS   ASSESSMENT: Allison [name] is a 75 y.o. female presenting today for yearly follow up of GERD.  She is maintained on omeprazole 20mg  once daily, she is doing well on this, she had some breakthrough reflux last night but reports she ate dinner late and went to bed shortly after, which she usually tries to avoid, has had no episodes of breakthrough reflux prior to this. She has no dysphagia, nausea, vomiting, abdominal pain, weight loss or changes in appetite, she is without rectal bleeding or melena. We will continue with  current PPI regimen. She should continue to stay upright 2-3 hours after eating and avoid trigger foods such as spicy, greasy, fatty foods, caffeine, chocolate and alcohol.   BP was elevated on initial evaluation today at 185/94, patient denies any symptoms of headache, blurred vision or dizziness. Upon recheck BP was improved at 151/71. I discussed with patient that she should keep an eye on her BP at home, checking it 2-3x/week and keeping a log to take to her PCP as she is currently maintained on two medications for BP control, as she has a visit with them in Feb 2023. If she develops severe headache, blurred vision or dizziness, she should let PCP know immediately as elevations in the 170s and 299B of systolic BP for prolonged periods of time can be dangerous, leading to stroke and other adverse events. Patient verbalized understanding.  She is due for screening colonoscopy in 2023, she inquired about cologuard, I discussed with her that Cologuard is intended for the qualitative detection of colorectal neoplasia associated DNA markers and for the presence of occult hemoglobin in human stool. A positive  result may indicate the presence of colorectal cancer (CRC) or pre cancerous polyps, and should be followed by colonoscopy. A negative Cologuard test result does not guarantee absence of cancer or pre cancerous polyp. False positives and false negatives do occur. In a clinical study, 13% of patients without colorectal cancer or advanced adenomas received a positive result (false positive) and 8% of patients with cancer received a negative result (false negative). Ultimately, our recommendation is screening with colonoscopy as this is the most accurate method to evaluate for precancerous polyps and CRC. Patient verbalized understanding.    PLAN:  Continue omeprazole 20mg  once daily, refill sent 2. Please monitor BP at home 2-3x/week, let PCP know if you develop headache, blurred vision or dizziness 3. Continue with diet and exercise 4. Plan for repeat colonoscopy dec 2023   Follow Up: 1 year  Allison Neale L. Alver Sorrow, MSN, APRN, AGNP-C Adult-Gerontology Nurse Practitioner [facility]

## 2021-05-05 DIAGNOSIS — J069 Acute upper respiratory infection, unspecified: Secondary | ICD-10-CM | POA: Diagnosis not present

## 2021-06-01 ENCOUNTER — Other Ambulatory Visit (INDEPENDENT_AMBULATORY_CARE_PROVIDER_SITE_OTHER): Payer: Self-pay | Admitting: Internal Medicine

## 2021-06-01 DIAGNOSIS — K219 Gastro-esophageal reflux disease without esophagitis: Secondary | ICD-10-CM

## 2021-07-03 DIAGNOSIS — M1991 Primary osteoarthritis, unspecified site: Secondary | ICD-10-CM | POA: Diagnosis not present

## 2021-07-03 DIAGNOSIS — I10 Essential (primary) hypertension: Secondary | ICD-10-CM | POA: Diagnosis not present

## 2021-07-03 DIAGNOSIS — E119 Type 2 diabetes mellitus without complications: Secondary | ICD-10-CM | POA: Diagnosis not present

## 2021-07-03 DIAGNOSIS — Z6832 Body mass index (BMI) 32.0-32.9, adult: Secondary | ICD-10-CM | POA: Diagnosis not present

## 2021-07-03 DIAGNOSIS — E6609 Other obesity due to excess calories: Secondary | ICD-10-CM | POA: Diagnosis not present

## 2021-08-31 ENCOUNTER — Other Ambulatory Visit (INDEPENDENT_AMBULATORY_CARE_PROVIDER_SITE_OTHER): Payer: Self-pay | Admitting: Gastroenterology

## 2021-08-31 DIAGNOSIS — K219 Gastro-esophageal reflux disease without esophagitis: Secondary | ICD-10-CM

## 2021-09-01 ENCOUNTER — Other Ambulatory Visit (INDEPENDENT_AMBULATORY_CARE_PROVIDER_SITE_OTHER): Payer: Self-pay

## 2021-09-01 DIAGNOSIS — K219 Gastro-esophageal reflux disease without esophagitis: Secondary | ICD-10-CM

## 2021-09-01 MED ORDER — OMEPRAZOLE 20 MG PO CPDR: 20.0000 mg | DELAYED_RELEASE_CAPSULE | Freq: Every day | ORAL | 3 refills | Status: DC

## 2021-11-05 DIAGNOSIS — Z1231 Encounter for screening mammogram for malignant neoplasm of breast: Secondary | ICD-10-CM | POA: Diagnosis not present

## 2021-11-05 DIAGNOSIS — Z01419 Encounter for gynecological examination (general) (routine) without abnormal findings: Secondary | ICD-10-CM | POA: Diagnosis not present

## 2021-11-05 DIAGNOSIS — Z6831 Body mass index (BMI) 31.0-31.9, adult: Secondary | ICD-10-CM | POA: Diagnosis not present

## 2022-03-05 DIAGNOSIS — K219 Gastro-esophageal reflux disease without esophagitis: Secondary | ICD-10-CM | POA: Diagnosis not present

## 2022-03-05 DIAGNOSIS — I10 Essential (primary) hypertension: Secondary | ICD-10-CM | POA: Diagnosis not present

## 2022-03-05 DIAGNOSIS — E119 Type 2 diabetes mellitus without complications: Secondary | ICD-10-CM | POA: Diagnosis not present

## 2022-03-05 DIAGNOSIS — Z6831 Body mass index (BMI) 31.0-31.9, adult: Secondary | ICD-10-CM | POA: Diagnosis not present

## 2022-03-05 DIAGNOSIS — M1991 Primary osteoarthritis, unspecified site: Secondary | ICD-10-CM | POA: Diagnosis not present

## 2022-03-10 DIAGNOSIS — Z23 Encounter for immunization: Secondary | ICD-10-CM | POA: Diagnosis not present

## 2022-03-29 ENCOUNTER — Encounter (INDEPENDENT_AMBULATORY_CARE_PROVIDER_SITE_OTHER): Payer: Self-pay | Admitting: *Deleted

## 2022-05-12 DIAGNOSIS — E119 Type 2 diabetes mellitus without complications: Secondary | ICD-10-CM | POA: Diagnosis not present

## 2022-06-03 DIAGNOSIS — E559 Vitamin D deficiency, unspecified: Secondary | ICD-10-CM | POA: Diagnosis not present

## 2022-06-03 DIAGNOSIS — M810 Age-related osteoporosis without current pathological fracture: Secondary | ICD-10-CM | POA: Diagnosis not present

## 2022-06-03 DIAGNOSIS — Z0001 Encounter for general adult medical examination with abnormal findings: Secondary | ICD-10-CM | POA: Diagnosis not present

## 2022-06-03 DIAGNOSIS — Z1331 Encounter for screening for depression: Secondary | ICD-10-CM | POA: Diagnosis not present

## 2022-06-03 DIAGNOSIS — E119 Type 2 diabetes mellitus without complications: Secondary | ICD-10-CM | POA: Diagnosis not present

## 2022-06-03 DIAGNOSIS — I10 Essential (primary) hypertension: Secondary | ICD-10-CM | POA: Diagnosis not present

## 2022-06-03 DIAGNOSIS — D518 Other vitamin B12 deficiency anemias: Secondary | ICD-10-CM | POA: Diagnosis not present

## 2022-06-03 DIAGNOSIS — E039 Hypothyroidism, unspecified: Secondary | ICD-10-CM | POA: Diagnosis not present

## 2022-06-03 DIAGNOSIS — Z6830 Body mass index (BMI) 30.0-30.9, adult: Secondary | ICD-10-CM | POA: Diagnosis not present

## 2022-06-03 DIAGNOSIS — E6609 Other obesity due to excess calories: Secondary | ICD-10-CM | POA: Diagnosis not present

## 2022-06-03 DIAGNOSIS — I700 Atherosclerosis of aorta: Secondary | ICD-10-CM | POA: Diagnosis not present

## 2022-06-03 DIAGNOSIS — K219 Gastro-esophageal reflux disease without esophagitis: Secondary | ICD-10-CM | POA: Diagnosis not present

## 2022-09-16 DIAGNOSIS — Z6830 Body mass index (BMI) 30.0-30.9, adult: Secondary | ICD-10-CM | POA: Diagnosis not present

## 2022-09-16 DIAGNOSIS — I10 Essential (primary) hypertension: Secondary | ICD-10-CM | POA: Diagnosis not present

## 2022-09-16 DIAGNOSIS — E119 Type 2 diabetes mellitus without complications: Secondary | ICD-10-CM | POA: Diagnosis not present

## 2022-09-16 DIAGNOSIS — E6609 Other obesity due to excess calories: Secondary | ICD-10-CM | POA: Diagnosis not present

## 2022-09-16 DIAGNOSIS — M810 Age-related osteoporosis without current pathological fracture: Secondary | ICD-10-CM | POA: Diagnosis not present

## 2022-10-14 DIAGNOSIS — C44391 Other specified malignant neoplasm of skin of nose: Secondary | ICD-10-CM | POA: Diagnosis not present

## 2022-10-14 DIAGNOSIS — L570 Actinic keratosis: Secondary | ICD-10-CM | POA: Diagnosis not present

## 2022-10-14 DIAGNOSIS — X32XXXA Exposure to sunlight, initial encounter: Secondary | ICD-10-CM | POA: Diagnosis not present

## 2022-10-14 DIAGNOSIS — C44321 Squamous cell carcinoma of skin of nose: Secondary | ICD-10-CM | POA: Diagnosis not present

## 2022-10-14 DIAGNOSIS — L821 Other seborrheic keratosis: Secondary | ICD-10-CM | POA: Diagnosis not present

## 2022-10-14 DIAGNOSIS — B078 Other viral warts: Secondary | ICD-10-CM | POA: Diagnosis not present

## 2022-10-14 DIAGNOSIS — C44319 Basal cell carcinoma of skin of other parts of face: Secondary | ICD-10-CM | POA: Diagnosis not present

## 2022-10-20 IMAGING — DX DG CHEST 2V
2 series · 2 of 2 positions shown · non-contrast
Comparison: 09/11/2013

CLINICAL DATA: Upper back pain

EXAM:
CHEST - 2 VIEW

[chest pa]
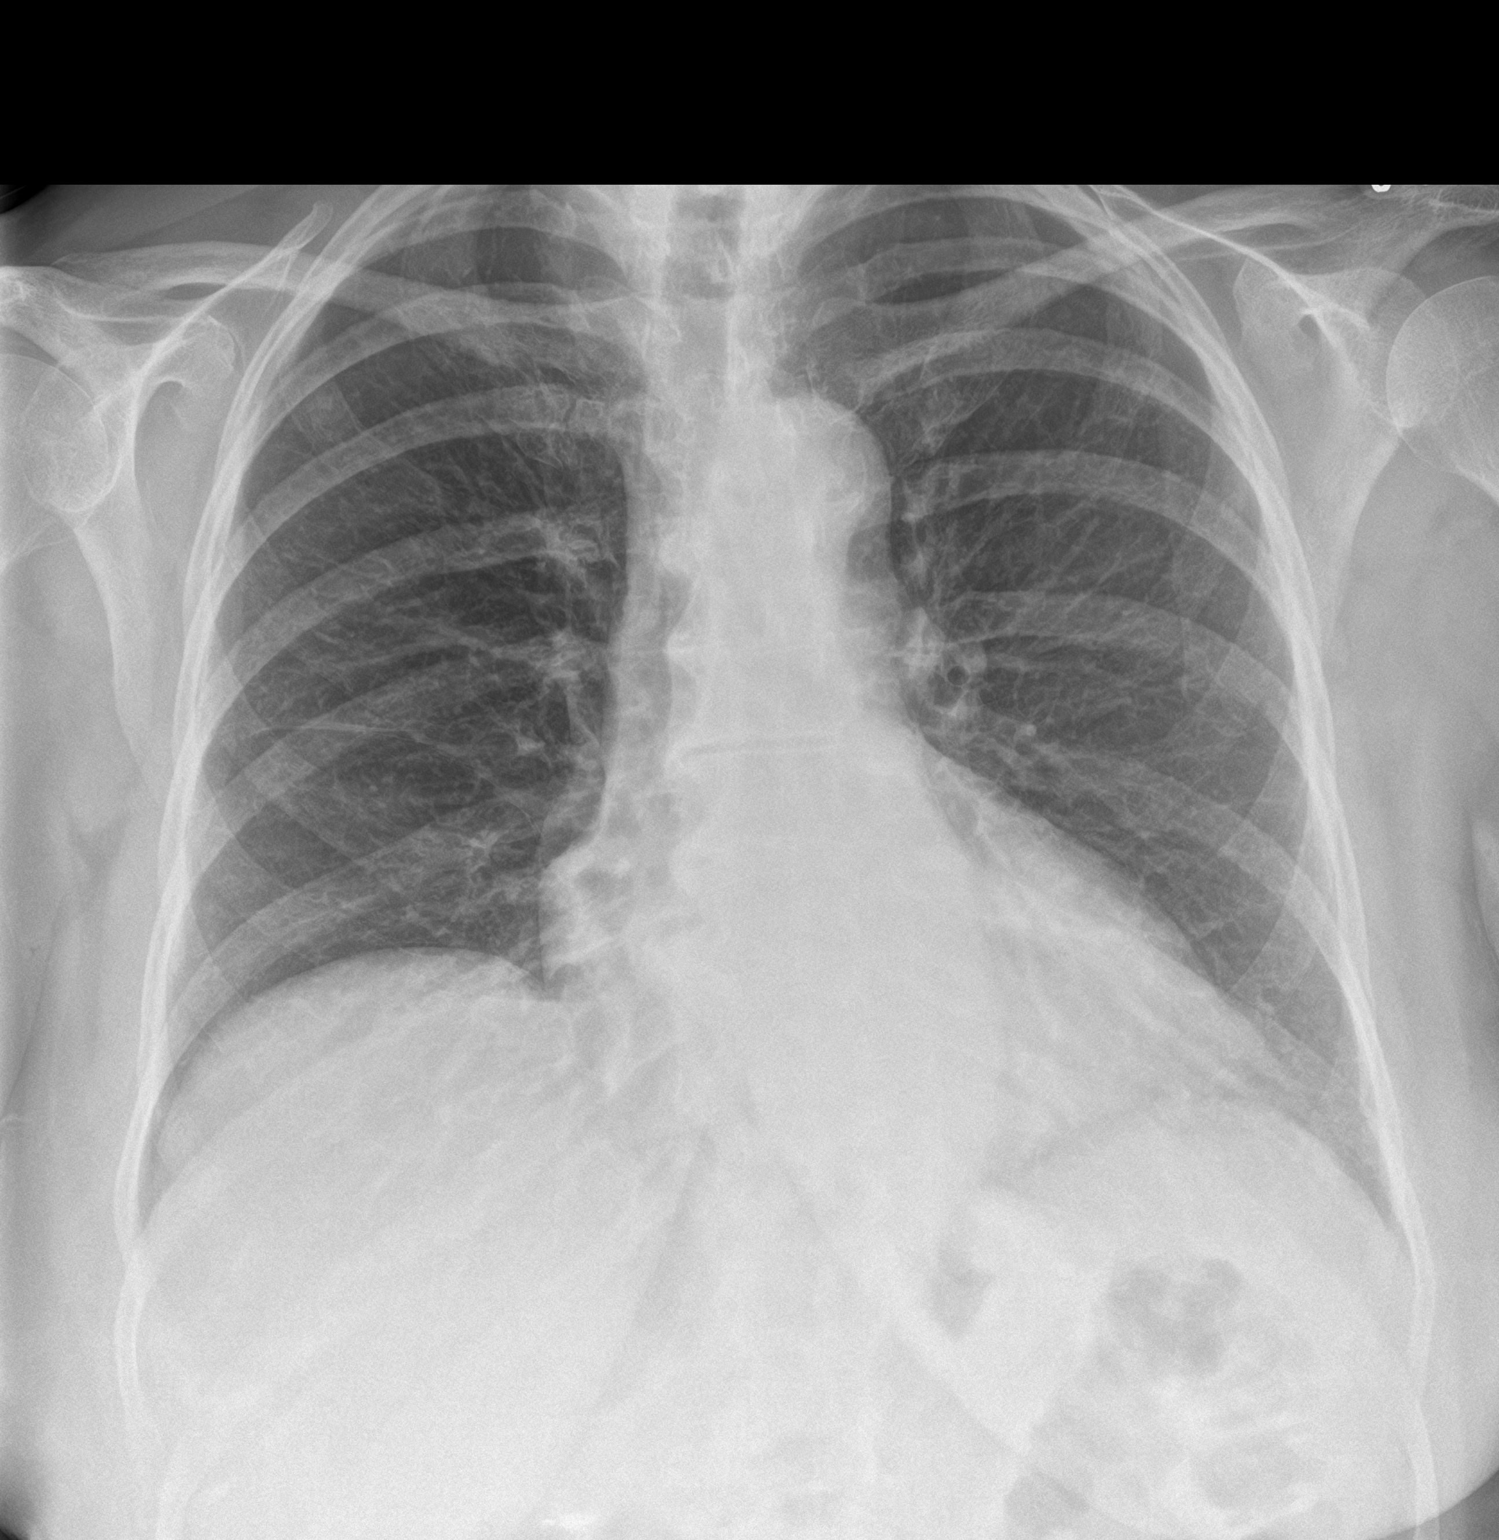

[chest lat]
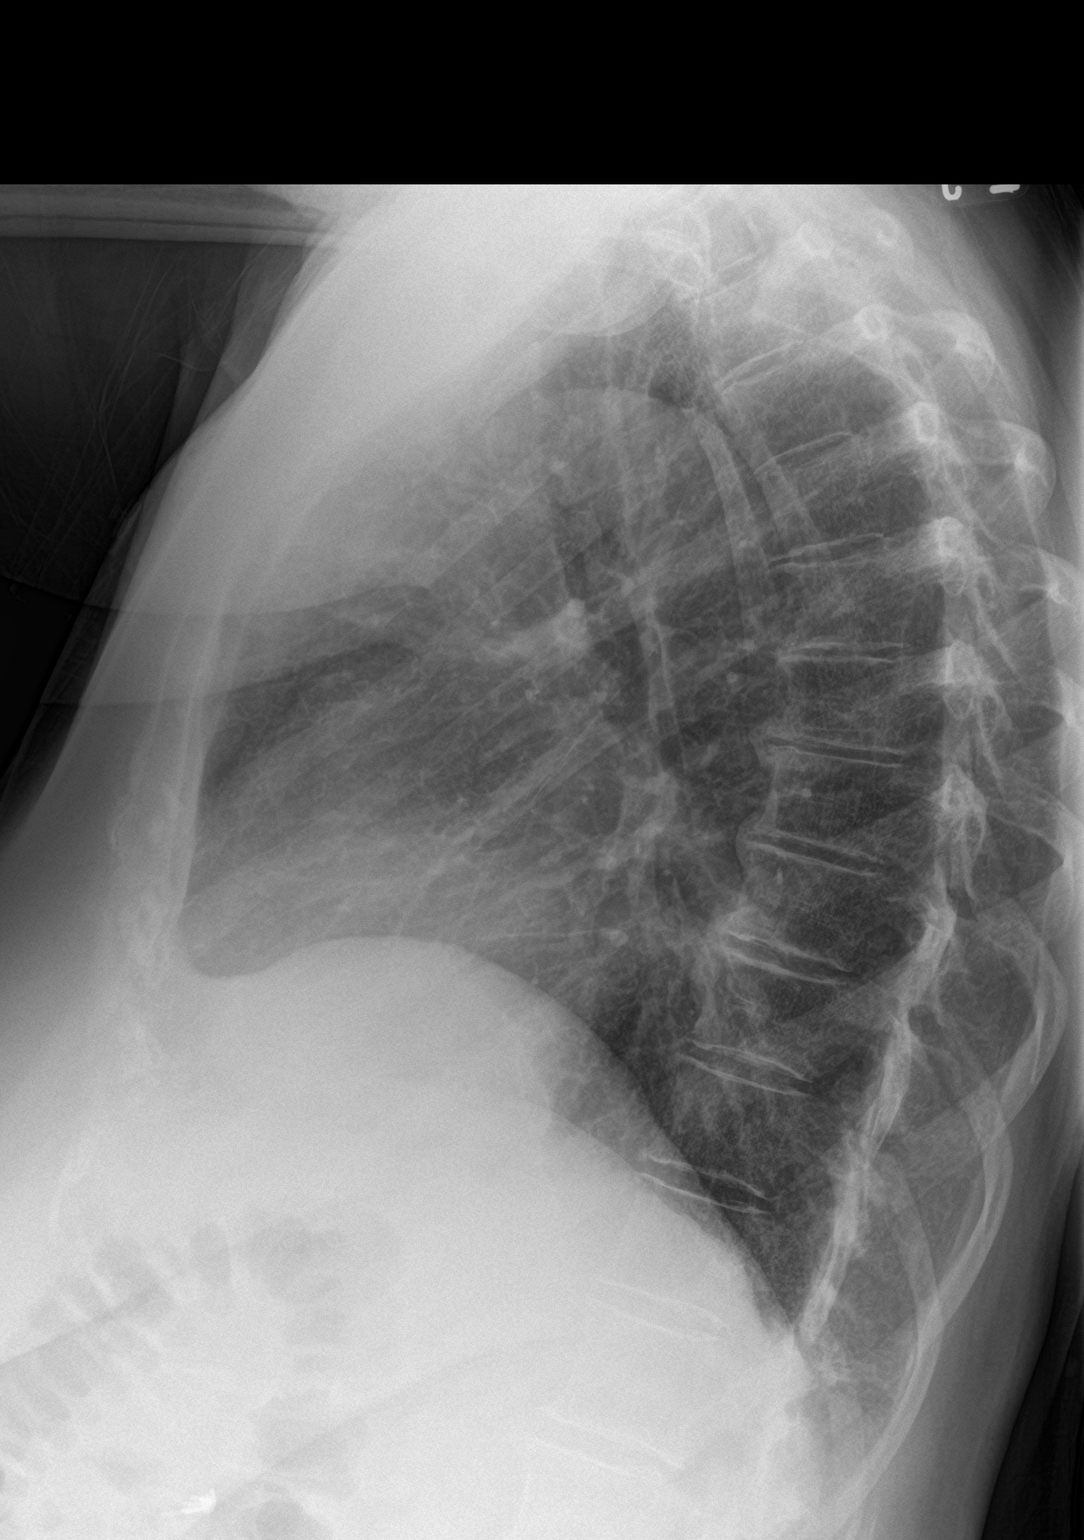

[2 of 2 positions shown; findings below may reference images not displayed]

FINDINGS: Heart and mediastinal contours are within normal limits. No focal
opacities or effusions. No acute bony abnormality. Mild degenerative
changes throughout the thoracic spine. No focal abnormality or acute
bony abnormality.
IMPRESSION: No active cardiopulmonary disease.

## 2022-10-20 IMAGING — CT CT ANGIO CHEST
2 of 7 series · 16 of 46 positions shown · IV contrast (Omnipaque or Isovue)
Comparison: Chest x-ray 12/27/2020 and CT of the chest on
05/12/2005

CLINICAL DATA: Chest pain and LOWER back pain. Suspect aortic
dissection.

EXAM:
CT ANGIOGRAPHY CHEST WITH CONTRAST
TECHNIQUE: Multidetector CT imaging of the chest was performed using the
standard protocol during bolus administration of intravenous
contrast. Multiplanar CT image reconstructions and MIPs were
obtained to evaluate the vascular anatomy.
CONTRAST:  100mL OMNIPAQUE IOHEXOL 350 MG/ML SOLN

[Series 9: lungs · axial · 0.68mm/px · z∈[+1300,+1574]mm · 13 of 161 slices shown]
[im 12/161  lung]
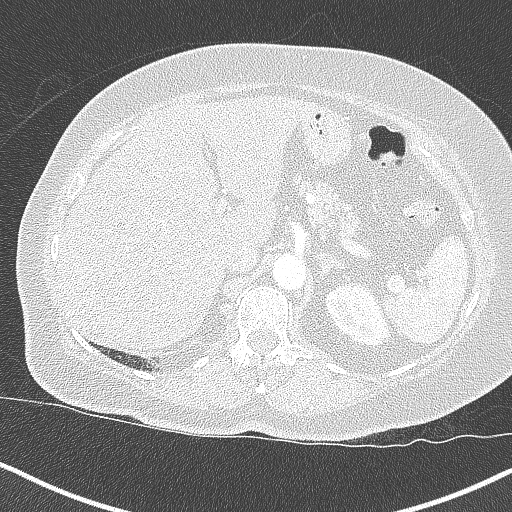
[im 23/161  soft-tissue]
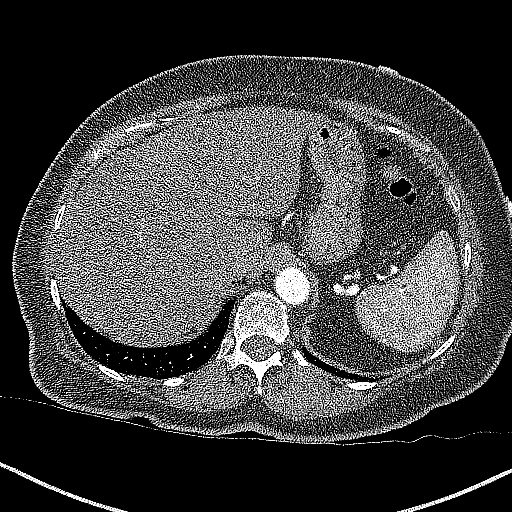
[im 35/161  lung]
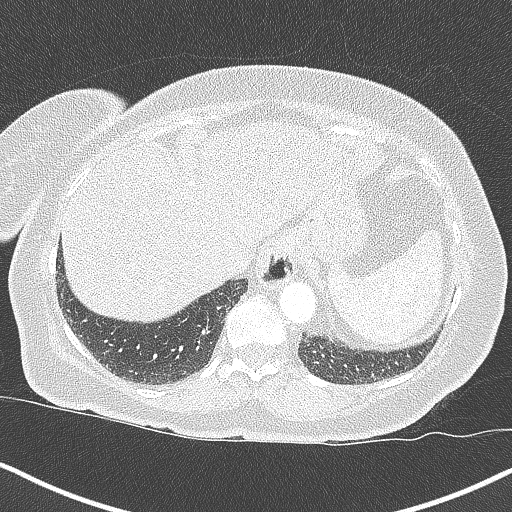
[im 46/161  soft-tissue]
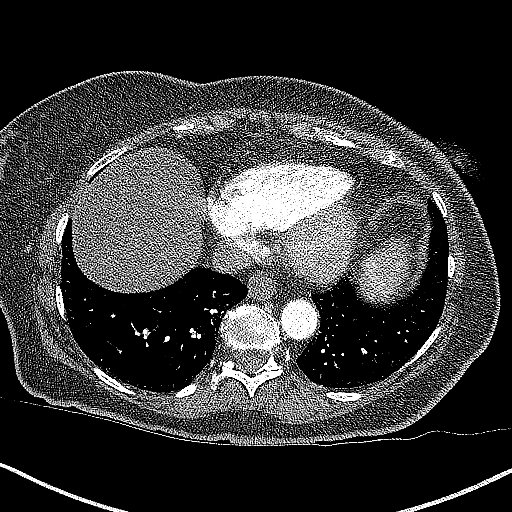
[im 58/161  lung]
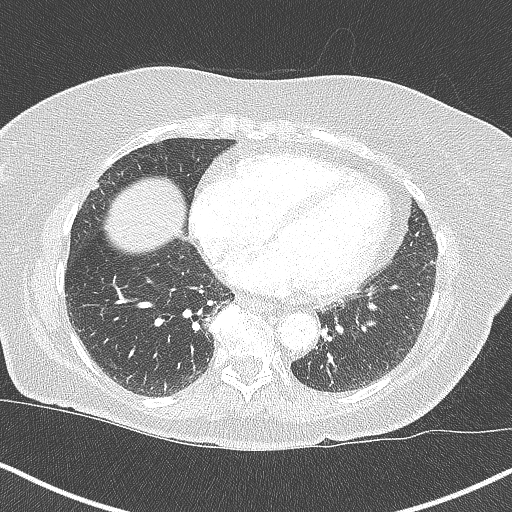
[im 69/161  soft-tissue]
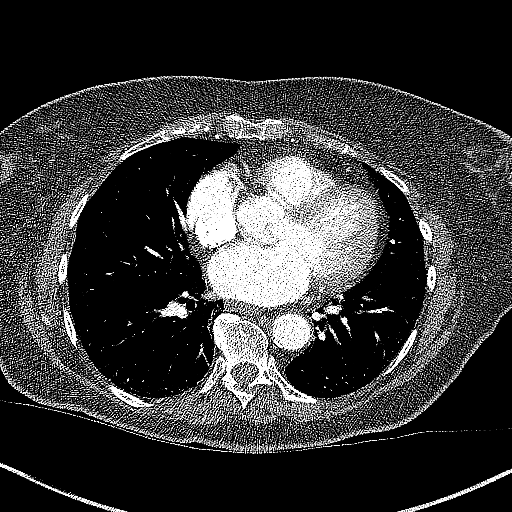
[im 81/161  lung]
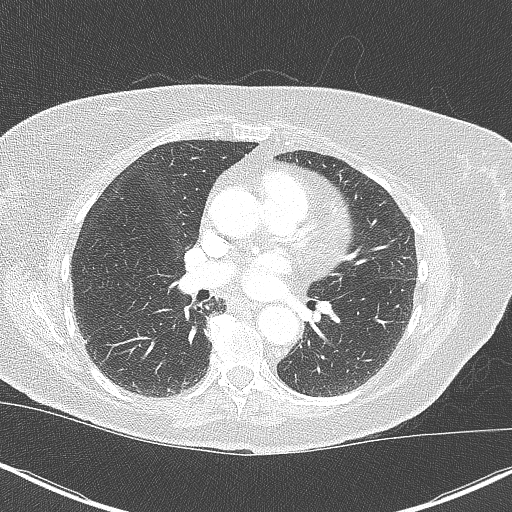
[im 92/161  soft-tissue]
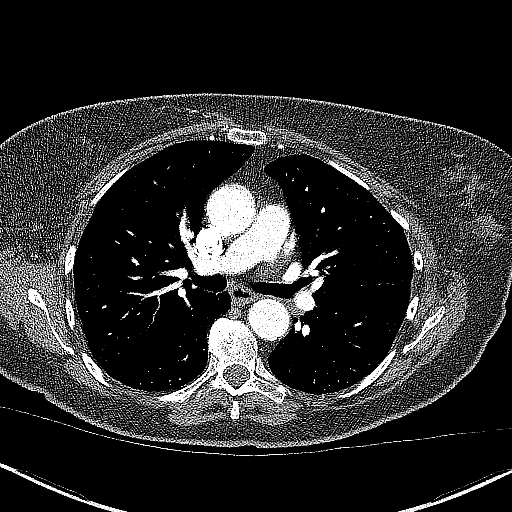
[im 103/161  lung]
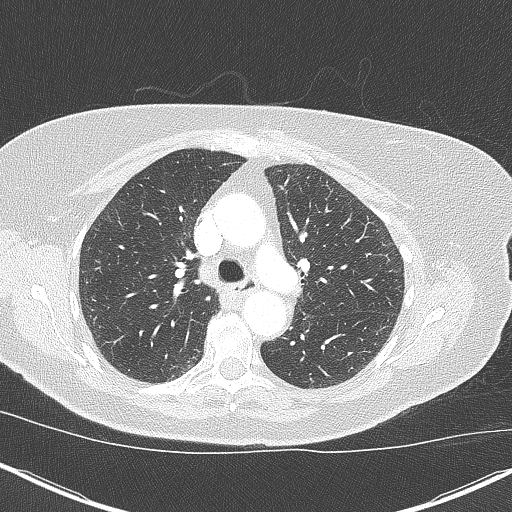
[im 115/161  soft-tissue]
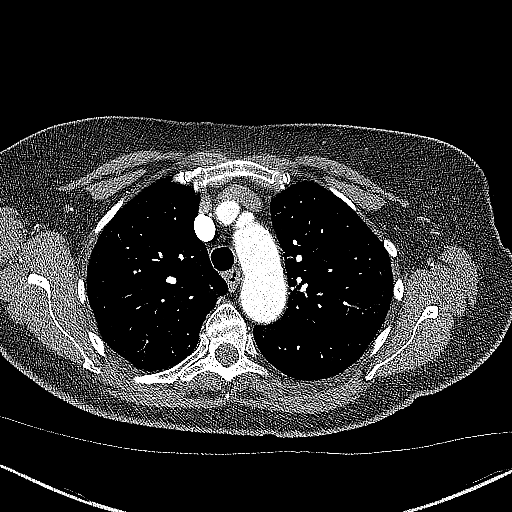
[im 126/161  lung]
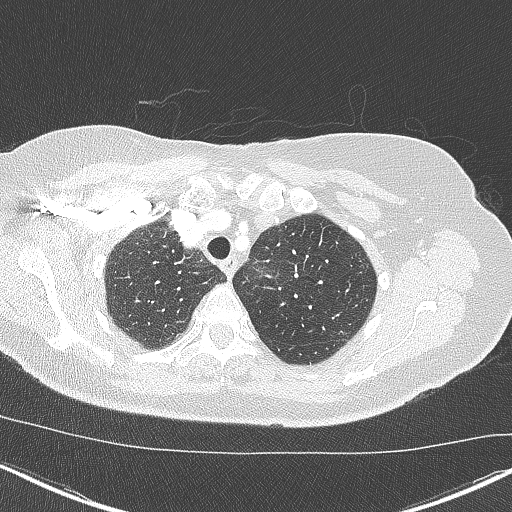
[im 138/161  soft-tissue]
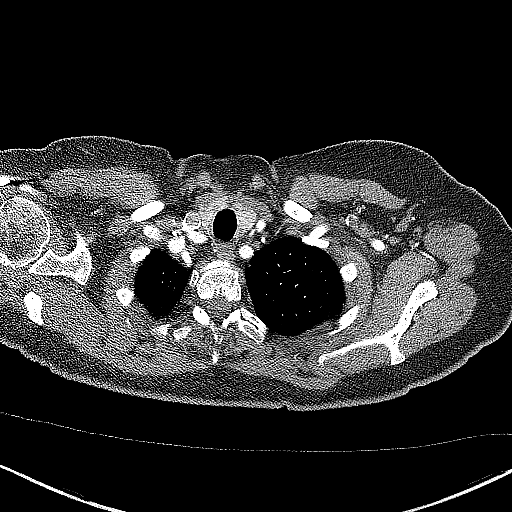
[im 149/161  lung]
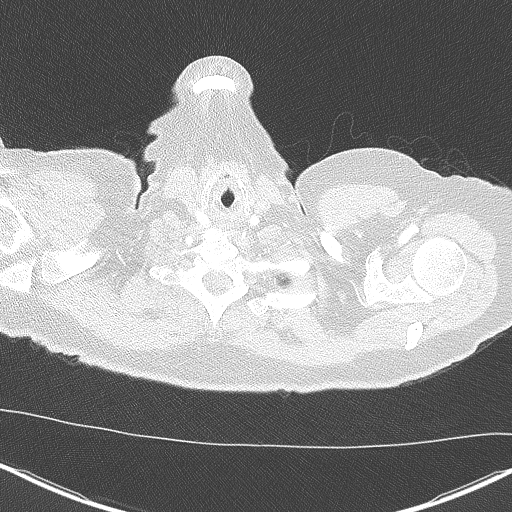

[Series 10: cor soft · coronal · 0.73mm/px · 3 of 144 slices shown]
[im 36/144  soft-tissue]
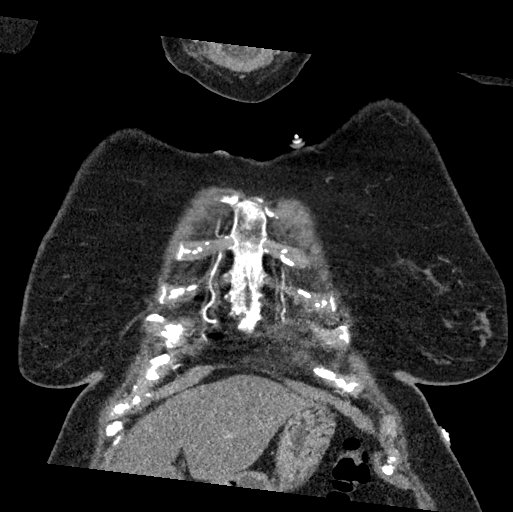
[im 72/144  soft-tissue]
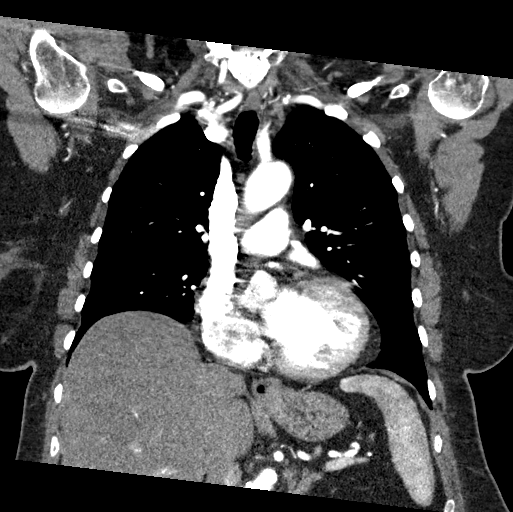
[im 108/144  soft-tissue]
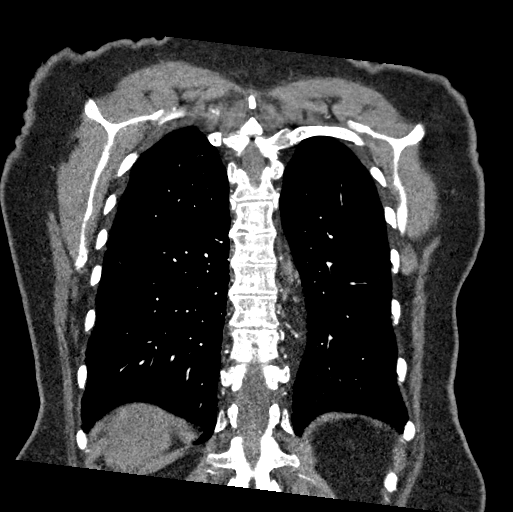

[16 of 46 positions shown; findings below may reference images not displayed]

FINDINGS: Cardiovascular: Heart size is normal. No pericardial effusion. No
significant coronary artery calcification. There is atherosclerotic
calcification of the thoracic aorta not associated with aneurysm or
dissection. The appearance of the pulmonary arteries is within
normal limits accounting for contrast bolus timing.

Mediastinum/Nodes: Small hiatal hernia. The visualized portion of
the thyroid gland has a normal appearance. No significant
mediastinal, hilar, or axillary adenopathy.

Lungs/Pleura: Lungs are clear. No pleural effusion or pneumothorax.

Upper Abdomen: Hepatic steatosis.  No acute abnormality.

Musculoskeletal: Mild midthoracic degenerative changes. No lytic or
blastic lesions.

Review of the MIP images confirms the above findings.
IMPRESSION: 1. No aortic dissection or aneurysm.
2. Aortic atherosclerosis.  ([id number])
3. Small hiatal hernia.
4. Hepatic steatosis.

## 2022-11-09 ENCOUNTER — Other Ambulatory Visit (INDEPENDENT_AMBULATORY_CARE_PROVIDER_SITE_OTHER): Payer: Self-pay | Admitting: Gastroenterology

## 2022-11-09 DIAGNOSIS — K219 Gastro-esophageal reflux disease without esophagitis: Secondary | ICD-10-CM

## 2022-11-09 NOTE — Telephone Encounter (Signed)
Patient called and made an appt for late Sept.  She is having nose surgery in the next few weeks and is very nervous about that.  She is hoping that you will refill this medication for her since she has made an appt.

## 2022-11-10 ENCOUNTER — Other Ambulatory Visit (INDEPENDENT_AMBULATORY_CARE_PROVIDER_SITE_OTHER): Payer: Self-pay | Admitting: Gastroenterology

## 2022-11-10 DIAGNOSIS — K219 Gastro-esophageal reflux disease without esophagitis: Secondary | ICD-10-CM

## 2022-11-10 MED ORDER — OMEPRAZOLE 20 MG PO CPDR: 20.0000 mg | DELAYED_RELEASE_CAPSULE | Freq: Every day | ORAL | 0 refills | Status: DC

## 2022-11-23 DIAGNOSIS — Z6831 Body mass index (BMI) 31.0-31.9, adult: Secondary | ICD-10-CM | POA: Diagnosis not present

## 2022-11-23 DIAGNOSIS — N390 Urinary tract infection, site not specified: Secondary | ICD-10-CM | POA: Diagnosis not present

## 2022-11-23 DIAGNOSIS — Z01419 Encounter for gynecological examination (general) (routine) without abnormal findings: Secondary | ICD-10-CM | POA: Diagnosis not present

## 2022-11-23 DIAGNOSIS — Z1231 Encounter for screening mammogram for malignant neoplasm of breast: Secondary | ICD-10-CM | POA: Diagnosis not present

## 2022-11-24 DIAGNOSIS — C44311 Basal cell carcinoma of skin of nose: Secondary | ICD-10-CM | POA: Diagnosis not present

## 2022-12-06 DIAGNOSIS — D2272 Melanocytic nevi of left lower limb, including hip: Secondary | ICD-10-CM | POA: Diagnosis not present

## 2022-12-06 DIAGNOSIS — D235 Other benign neoplasm of skin of trunk: Secondary | ICD-10-CM | POA: Diagnosis not present

## 2022-12-06 DIAGNOSIS — D225 Melanocytic nevi of trunk: Secondary | ICD-10-CM | POA: Diagnosis not present

## 2022-12-06 DIAGNOSIS — Z85828 Personal history of other malignant neoplasm of skin: Secondary | ICD-10-CM | POA: Diagnosis not present

## 2022-12-06 DIAGNOSIS — D485 Neoplasm of uncertain behavior of skin: Secondary | ICD-10-CM | POA: Diagnosis not present

## 2022-12-06 DIAGNOSIS — Z1283 Encounter for screening for malignant neoplasm of skin: Secondary | ICD-10-CM | POA: Diagnosis not present

## 2022-12-06 DIAGNOSIS — Z08 Encounter for follow-up examination after completed treatment for malignant neoplasm: Secondary | ICD-10-CM | POA: Diagnosis not present

## 2022-12-06 DIAGNOSIS — I781 Nevus, non-neoplastic: Secondary | ICD-10-CM | POA: Diagnosis not present

## 2023-02-01 ENCOUNTER — Ambulatory Visit: Payer: Medicare PPO | Admitting: Gastroenterology

## 2023-02-17 ENCOUNTER — Ambulatory Visit (INDEPENDENT_AMBULATORY_CARE_PROVIDER_SITE_OTHER): Payer: Medicare PPO | Admitting: Gastroenterology

## 2023-02-17 VITALS — BP 157/81 | HR 59 | Temp 97.8°F | Ht 63.5 in | Wt 176.4 lb

## 2023-02-17 DIAGNOSIS — K219 Gastro-esophageal reflux disease without esophagitis: Secondary | ICD-10-CM | POA: Diagnosis not present

## 2023-02-17 DIAGNOSIS — Z1211 Encounter for screening for malignant neoplasm of colon: Secondary | ICD-10-CM

## 2023-02-17 MED ORDER — OMEPRAZOLE 20 MG PO CPDR: 20.0000 mg | DELAYED_RELEASE_CAPSULE | Freq: Every day | ORAL | 3 refills | Status: AC

## 2023-02-17 NOTE — Progress Notes (Deleted)
Doing well on omeprazole 20mg  daily. Mauy have occasional breakthrough if she eats something spicy. Sometimes she may need to repeat her omeprazole dose, this occurs very infrequently. No abdominal pain, no nausea, vomiting, no rectal bleeding or melena.   She is interested in doing cologuard in place of colonscopy.   She has lost  about 8 pounds since her last visit in 2022, she is diabetic and tries to watch what she eats.    The patient was found to have elevated blood pressure when vital signs were checked in the office. The blood pressure was rechecked by the nursing staff and it was found be persistently elevated >140/90 mmHg. I personally advised to the patient to follow up closely with PCP for hypertension control.

## 2023-02-17 NOTE — Patient Instructions (Signed)
Please continue with omeprazole 20mg  daily, I have sent a refill of this I have ordered cologuard for colon cancer screening which will be mailed to your house, I will be in touch with the results once you have completed this  Follow up 1 year  It was a pleasure to see you today. I want to create trusting relationships with patients and provide genuine, compassionate, and quality care. I truly value your feedback! please be on the lookout for a survey regarding your visit with me today. I appreciate your input about our visit and your time in completing this!    Renan Danese L. Jeanmarie Hubert, MSN, APRN, AGNP-C Adult-Gerontology Nurse Practitioner [facility]

## 2023-02-17 NOTE — Progress Notes (Addendum)
Referring Provider: Elfredia Nevins, MD Primary Care Physician:  Elfredia Nevins, MD Primary GI Physician: Dr. Levon Hedger   Chief Complaint  Patient presents with   Follow-up    GERD. Pt states medication helps with reflux   HPI:   Allison [name] is a 77 y.o. female with past medical history of anxiety, arthritis, diverticulosis, GERD, hiatal hernia, heart murmur, hemorrhoids, high cholesterol, HTN, IBS.    Patient presenting today for GERD  Last seen December 2022, at that time maintained on omeprazole 20mg  daily.   Recommended to continue with omeprazole 20mg  daily, plan for repeat colonoscopy December 2023   Present: Doing well on omeprazole 20mg  daily. Mauy have occasional breakthrough if she eats something spicy. Sometimes she may need to repeat her omeprazole dose, this occurs very infrequently. No red flag symptoms. Patient denies melena, hematochezia, nausea, vomiting, diarrhea, constipation, dysphagia, odyonophagia, early satiety or weight loss.   She is interested in doing cologuard in place of colonscopy.    She has lost  about 8 pounds since her last visit in 2022, she is diabetic and tries to watch what she eats. Working on trying to lose a few more pounds.       Last Colonoscopy:04/12/12 prep excellent, scatter diverticula at sigmoid and transverse colon, no evidence of colonic polyps or other mucosal abnormalities, normal rectal mucosa, hemorrhoids below dentate line along with anal papillae Last Endoscopy:08/20/10, 4 gastric polyps   Recommendations:  Colonoscopy dec 2023   Past Medical History:  Diagnosis Date   Anxiety    Arthritis    degenerative joint disease   Diverticulosis    Gastroesophageal reflux    H/O hiatal hernia    Heart murmur    Hemorrhoid    hx. of   Hypercholesteremia    Hypertension    Irritable bowel syndrome    UTI symptoms    6 weeks ago-tx. Cipro-asymptomatic now    Past Surgical History:  Procedure Laterality Date    ABDOMINAL HYSTERECTOMY     '91 uterus and 1 ovary, '08- other ovary removed.   BREAST LUMPECTOMY Left    more axilla area benign   CESAREAN SECTION     '73, '76    CHOLECYSTECTOMY     '08- laparoscopic   COLONOSCOPY  08/11/2001   COLONOSCOPY  04/12/2012   Procedure: COLONOSCOPY;  Surgeon: Malissa Hippo, MD;  [facility]: [facility];  Service: Endoscopy;  Laterality: N/A;  1030   ESOPHAGOGASTRODUODENOSCOPY  08/19/2010   ESOPHAGOGASTRODUODENOSCOPY  09/24/2008   TOTAL KNEE ARTHROPLASTY Right 09/20/2013   Procedure: RIGHT TOTAL KNEE REPLACEMENT;  Surgeon: Javier Docker, MD;  Location: [facility];  Service: Orthopedics;  Laterality: Right;    Current Outpatient Medications  Medication Sig Dispense Refill   aspirin EC 81 MG tablet Take 81 mg by mouth daily.     bisoprolol-hydrochlorothiazide (ZIAC) 10-6.25 MG per tablet Take 1 tablet by mouth every morning.      Coenzyme Q10 (CO Q 10) 100 MG CAPS Take 200 mg by mouth daily.     glimepiride (AMARYL) 2 MG tablet Take 2 mg by mouth daily.     Krill Oil 350 MG CAPS Take by mouth.     losartan (COZAAR) 100 MG tablet Take 100 mg by mouth daily.      multivitamin-iron-minerals-folic acid (CENTRUM) chewable tablet Chew 1 tablet by mouth daily.     omeprazole (PRILOSEC) 20 MG capsule Take 1 capsule (20 mg total) by mouth daily. 90 capsule 0  simvastatin (ZOCOR) 20 MG tablet Take 40 mg by mouth at bedtime.      No current facility-administered medications for this visit.    Allergies as of 02/17/2023 - Review Complete 02/17/2023  Allergen Reaction Noted   Codeine     Demerol [meperidine] Nausea And Vomiting 09/10/2013   Fentanyl Nausea And Vomiting 02/21/2020   Sulfa antibiotics  02/21/2020   Sulfonamide derivatives      Family History  Problem Relation Age of Onset   COPD Mother    Irregular heart beat Father    Healthy Sister    Diverticulitis Brother    Diabetes Brother    Healthy Son    Healthy Son     Social History    Socioeconomic History   Marital status: Widowed    Spouse name: Not on file   Number of children: Not on file   Years of education: Not on file   Highest education level: Not on file  Occupational History   Not on file  Tobacco Use   Smoking status: Never   Smokeless tobacco: Never  Substance and Sexual Activity   Alcohol use: No   Drug use: No   Sexual activity: Never  Other Topics Concern   Not on file  Social History Narrative   Not on file   Social Determinants of Health   Financial Resource Strain: Not on file  Food Insecurity: Not on file  Transportation Needs: Not on file  Physical Activity: Not on file  Stress: Not on file  Social Connections: Not on file    Review of systems General: negative for malaise, night sweats, fever, chills, weight loss Neck: Negative for lumps, goiter, pain and significant neck swelling Resp: Negative for cough, wheezing, dyspnea at rest CV: Negative for chest pain, leg swelling, palpitations, orthopnea GI: denies melena, hematochezia, nausea, vomiting, diarrhea, constipation, dysphagia, odyonophagia, early satiety or unintentional weight loss.  MSK: Negative for joint pain or swelling, back pain, and muscle pain. Derm: Negative for itching or rash Psych: Denies depression, anxiety, memory loss, confusion. No homicidal or suicidal ideation.  Heme: Negative for prolonged bleeding, bruising easily, and swollen nodes. Endocrine: Negative for cold or heat intolerance, polyuria, polydipsia and goiter. Neuro: negative for tremor, gait imbalance, syncope and seizures. The remainder of the review of systems is noncontributory.  Physical Exam: BP (!) 157/81 (BP Location: Right Arm, Patient Position: Sitting, Cuff Size: Normal)   Pulse (!) 59   Temp 97.8 F (36.6 C) (Oral)   Ht 5' 3.5" (1.613 m)   Wt 176 lb 6.4 oz (80 kg)   BMI 30.76 kg/m  General:   Alert and oriented. No distress noted. Pleasant and cooperative.  Head:   Normocephalic and atraumatic. Eyes:  Conjuctiva clear without scleral icterus. Mouth:  Oral mucosa pink and moist. Good dentition. No lesions. Heart: Normal rate and rhythm, s1 and s2 heart sounds present.  Lungs: Clear lung sounds in all lobes. Respirations equal and unlabored. Abdomen:  +BS, soft, non-tender and non-distended. No rebound or guarding. No HSM or masses noted. Derm: No palmar erythema or jaundice Msk:  Symmetrical without gross deformities. Normal posture. Extremities:  Without edema. Neurologic:  Alert and  oriented x4 Psych:  Alert and cooperative. Normal mood and affect.  Invalid input(s): "6 MONTHS"   ASSESSMENT: Allison [name] is a 77 y.o. female presenting today for follow up of GERD  GERD:doing well on omeprazole 20mg  daily, very infrequent breakthrough which she will repeat her PPI dose for.  Can continue with omeprazole 20mg  daily for now, good reflux precautions to include being mindful of greasy, spicy, fried, citrus foods, caffeine, carbonated drinks, chocolate and alcohol as these can increase reflux symptoms, Stay upright 2-3 hours after eating, prior to lying down and avoid eating late in the evenings.  Colon cancer screening: last colonoscopy 2013, no family history or history of colon polyps, she is of average risk. She prefers to do cologuard vs. Colonoscopy for screening. I had an in depth conversation with the patient regarding cologuard. Cologuard is intended for the qualitative detection of colorectal neoplasia associated DNA markers and for the presence of occult hemoglobin in human stool. A positive result may indicate the presence of colorectal cancer (CRC) or pre cancerous polyps, and should be followed by colonoscopy. A negative Cologuard test result does not guarantee absence of cancer or pre cancerous polyp. False positives and false negatives do occur. Patient verbalized understanding and would like to proceed with cologuard at this time.    PLAN:   Omeprazole 20mg  daily, refill sent  2. Reflux precautions  3. cologuard  All questions were answered, patient verbalized understanding and is in agreement with plan as outlined above.   Follow Up: 1 year  Allison Swailes L. Jeanmarie Hubert, MSN, APRN, AGNP-C Adult-Gerontology Nurse Practitioner [facility]  I have reviewed the note and agree with the APP's assessment as described in this progress note  May consider discussing possibility of TIF if interested in not taking PPI chronically.  Katrinka Blazing, MD Gastroenterology and Hepatology [facility]

## 2023-03-01 DIAGNOSIS — L821 Other seborrheic keratosis: Secondary | ICD-10-CM | POA: Diagnosis not present

## 2023-03-01 DIAGNOSIS — L905 Scar conditions and fibrosis of skin: Secondary | ICD-10-CM | POA: Diagnosis not present

## 2023-03-01 DIAGNOSIS — D485 Neoplasm of uncertain behavior of skin: Secondary | ICD-10-CM | POA: Diagnosis not present

## 2023-03-22 DIAGNOSIS — E6609 Other obesity due to excess calories: Secondary | ICD-10-CM | POA: Diagnosis not present

## 2023-03-22 DIAGNOSIS — B070 Plantar wart: Secondary | ICD-10-CM | POA: Diagnosis not present

## 2023-03-22 DIAGNOSIS — E1165 Type 2 diabetes mellitus with hyperglycemia: Secondary | ICD-10-CM | POA: Diagnosis not present

## 2023-03-22 DIAGNOSIS — M21619 Bunion of unspecified foot: Secondary | ICD-10-CM | POA: Diagnosis not present

## 2023-03-22 DIAGNOSIS — M810 Age-related osteoporosis without current pathological fracture: Secondary | ICD-10-CM | POA: Diagnosis not present

## 2023-03-22 DIAGNOSIS — Z6830 Body mass index (BMI) 30.0-30.9, adult: Secondary | ICD-10-CM | POA: Diagnosis not present

## 2023-04-11 DIAGNOSIS — L821 Other seborrheic keratosis: Secondary | ICD-10-CM | POA: Diagnosis not present

## 2023-04-11 DIAGNOSIS — D2271 Melanocytic nevi of right lower limb, including hip: Secondary | ICD-10-CM | POA: Diagnosis not present

## 2023-04-11 DIAGNOSIS — Z85828 Personal history of other malignant neoplasm of skin: Secondary | ICD-10-CM | POA: Diagnosis not present

## 2023-04-11 DIAGNOSIS — Z08 Encounter for follow-up examination after completed treatment for malignant neoplasm: Secondary | ICD-10-CM | POA: Diagnosis not present

## 2023-04-11 DIAGNOSIS — D225 Melanocytic nevi of trunk: Secondary | ICD-10-CM | POA: Diagnosis not present

## 2023-04-11 DIAGNOSIS — L820 Inflamed seborrheic keratosis: Secondary | ICD-10-CM | POA: Diagnosis not present

## 2023-04-11 DIAGNOSIS — B078 Other viral warts: Secondary | ICD-10-CM | POA: Diagnosis not present

## 2023-04-11 DIAGNOSIS — D485 Neoplasm of uncertain behavior of skin: Secondary | ICD-10-CM | POA: Diagnosis not present

## 2023-04-11 DIAGNOSIS — D2372 Other benign neoplasm of skin of left lower limb, including hip: Secondary | ICD-10-CM | POA: Diagnosis not present

## 2023-04-11 DIAGNOSIS — Z1283 Encounter for screening for malignant neoplasm of skin: Secondary | ICD-10-CM | POA: Diagnosis not present

## 2023-05-18 DIAGNOSIS — E119 Type 2 diabetes mellitus without complications: Secondary | ICD-10-CM | POA: Diagnosis not present

## 2023-06-02 DIAGNOSIS — Z1211 Encounter for screening for malignant neoplasm of colon: Secondary | ICD-10-CM | POA: Diagnosis not present

## 2023-06-08 LAB — COLOGUARD: COLOGUARD: NEGATIVE

## 2023-09-05 DIAGNOSIS — Z1283 Encounter for screening for malignant neoplasm of skin: Secondary | ICD-10-CM | POA: Diagnosis not present

## 2023-09-05 DIAGNOSIS — D225 Melanocytic nevi of trunk: Secondary | ICD-10-CM | POA: Diagnosis not present

## 2023-10-20 DIAGNOSIS — E6609 Other obesity due to excess calories: Secondary | ICD-10-CM | POA: Diagnosis not present

## 2023-10-20 DIAGNOSIS — M810 Age-related osteoporosis without current pathological fracture: Secondary | ICD-10-CM | POA: Diagnosis not present

## 2023-10-20 DIAGNOSIS — E1165 Type 2 diabetes mellitus with hyperglycemia: Secondary | ICD-10-CM | POA: Diagnosis not present

## 2023-10-20 DIAGNOSIS — M1991 Primary osteoarthritis, unspecified site: Secondary | ICD-10-CM | POA: Diagnosis not present

## 2023-10-20 DIAGNOSIS — Z6830 Body mass index (BMI) 30.0-30.9, adult: Secondary | ICD-10-CM | POA: Diagnosis not present

## 2023-10-20 DIAGNOSIS — M21619 Bunion of unspecified foot: Secondary | ICD-10-CM | POA: Diagnosis not present

## 2023-10-20 DIAGNOSIS — K219 Gastro-esophageal reflux disease without esophagitis: Secondary | ICD-10-CM | POA: Diagnosis not present

## 2023-10-20 DIAGNOSIS — B070 Plantar wart: Secondary | ICD-10-CM | POA: Diagnosis not present

## 2023-10-20 DIAGNOSIS — I10 Essential (primary) hypertension: Secondary | ICD-10-CM | POA: Diagnosis not present

## 2023-10-21 DIAGNOSIS — I10 Essential (primary) hypertension: Secondary | ICD-10-CM | POA: Diagnosis not present

## 2023-10-21 DIAGNOSIS — E1165 Type 2 diabetes mellitus with hyperglycemia: Secondary | ICD-10-CM | POA: Diagnosis not present

## 2023-10-21 DIAGNOSIS — Z9229 Personal history of other drug therapy: Secondary | ICD-10-CM | POA: Diagnosis not present

## 2023-10-21 DIAGNOSIS — M810 Age-related osteoporosis without current pathological fracture: Secondary | ICD-10-CM | POA: Diagnosis not present

## 2023-10-21 DIAGNOSIS — Z0001 Encounter for general adult medical examination with abnormal findings: Secondary | ICD-10-CM | POA: Diagnosis not present

## 2023-11-28 DIAGNOSIS — Z6829 Body mass index (BMI) 29.0-29.9, adult: Secondary | ICD-10-CM | POA: Diagnosis not present

## 2023-11-28 DIAGNOSIS — N390 Urinary tract infection, site not specified: Secondary | ICD-10-CM | POA: Diagnosis not present

## 2023-11-28 DIAGNOSIS — Z1231 Encounter for screening mammogram for malignant neoplasm of breast: Secondary | ICD-10-CM | POA: Diagnosis not present

## 2023-11-28 DIAGNOSIS — Z01419 Encounter for gynecological examination (general) (routine) without abnormal findings: Secondary | ICD-10-CM | POA: Diagnosis not present

## 2024-01-01 DIAGNOSIS — M62830 Muscle spasm of back: Secondary | ICD-10-CM | POA: Diagnosis not present

## 2024-01-01 DIAGNOSIS — M545 Low back pain, unspecified: Secondary | ICD-10-CM | POA: Diagnosis not present

## 2024-01-04 DIAGNOSIS — M810 Age-related osteoporosis without current pathological fracture: Secondary | ICD-10-CM | POA: Diagnosis not present

## 2024-01-04 DIAGNOSIS — Z6830 Body mass index (BMI) 30.0-30.9, adult: Secondary | ICD-10-CM | POA: Diagnosis not present

## 2024-01-04 DIAGNOSIS — M5416 Radiculopathy, lumbar region: Secondary | ICD-10-CM | POA: Diagnosis not present

## 2024-01-04 DIAGNOSIS — I10 Essential (primary) hypertension: Secondary | ICD-10-CM | POA: Diagnosis not present

## 2024-01-04 DIAGNOSIS — E1165 Type 2 diabetes mellitus with hyperglycemia: Secondary | ICD-10-CM | POA: Diagnosis not present

## 2024-01-04 DIAGNOSIS — M5431 Sciatica, right side: Secondary | ICD-10-CM | POA: Diagnosis not present

## 2024-01-04 DIAGNOSIS — E6609 Other obesity due to excess calories: Secondary | ICD-10-CM | POA: Diagnosis not present

## 2024-01-04 DIAGNOSIS — M1991 Primary osteoarthritis, unspecified site: Secondary | ICD-10-CM | POA: Diagnosis not present

## 2024-02-20 ENCOUNTER — Ambulatory Visit (INDEPENDENT_AMBULATORY_CARE_PROVIDER_SITE_OTHER): Payer: Medicare PPO | Admitting: Gastroenterology

## 2024-03-09 ENCOUNTER — Other Ambulatory Visit (HOSPITAL_COMMUNITY): Payer: Self-pay

## 2024-03-09 DIAGNOSIS — M5416 Radiculopathy, lumbar region: Secondary | ICD-10-CM

## 2024-03-16 ENCOUNTER — Ambulatory Visit (HOSPITAL_COMMUNITY)
Admission: RE | Admit: 2024-03-16 | Discharge: 2024-03-16 | Disposition: A | Discharge status: Home / Self Care | Source: Ambulatory Visit

## 2024-03-16 DIAGNOSIS — M5416 Radiculopathy, lumbar region: Secondary | ICD-10-CM | POA: Insufficient documentation

## 2024-04-17 ENCOUNTER — Ambulatory Visit

## 2024-04-26 ENCOUNTER — Ambulatory Visit (INDEPENDENT_AMBULATORY_CARE_PROVIDER_SITE_OTHER): Admitting: Gastroenterology
